# Patient Record
Sex: Female | Born: 1980
Health system: Southern US, Community
[De-identification: ages and names within clinical notes are randomized; demographics above are authoritative.]

## PROBLEM LIST (undated history)

## (undated) DIAGNOSIS — R7989 Other specified abnormal findings of blood chemistry: Secondary | ICD-10-CM

## (undated) DIAGNOSIS — G4730 Sleep apnea, unspecified: Secondary | ICD-10-CM

## (undated) DIAGNOSIS — E119 Type 2 diabetes mellitus without complications: Secondary | ICD-10-CM

## (undated) DIAGNOSIS — N390 Urinary tract infection, site not specified: Secondary | ICD-10-CM

## (undated) DIAGNOSIS — J329 Chronic sinusitis, unspecified: Secondary | ICD-10-CM

## (undated) DIAGNOSIS — N912 Amenorrhea, unspecified: Secondary | ICD-10-CM

## (undated) DIAGNOSIS — A599 Trichomoniasis, unspecified: Secondary | ICD-10-CM

## (undated) DIAGNOSIS — K760 Fatty (change of) liver, not elsewhere classified: Secondary | ICD-10-CM

## (undated) DIAGNOSIS — R200 Anesthesia of skin: Secondary | ICD-10-CM

## (undated) DIAGNOSIS — R112 Nausea with vomiting, unspecified: Secondary | ICD-10-CM

## (undated) DIAGNOSIS — N898 Other specified noninflammatory disorders of vagina: Secondary | ICD-10-CM

## (undated) DIAGNOSIS — R5383 Other fatigue: Secondary | ICD-10-CM

## (undated) DIAGNOSIS — R7303 Prediabetes: Secondary | ICD-10-CM

## (undated) DIAGNOSIS — D649 Anemia, unspecified: Secondary | ICD-10-CM

## (undated) DIAGNOSIS — I10 Essential (primary) hypertension: Secondary | ICD-10-CM

## (undated) DIAGNOSIS — R059 Cough, unspecified: Secondary | ICD-10-CM

## (undated) DIAGNOSIS — R300 Dysuria: Secondary | ICD-10-CM

## (undated) DIAGNOSIS — Z9889 Other specified postprocedural states: Secondary | ICD-10-CM

## (undated) DIAGNOSIS — E669 Obesity, unspecified: Secondary | ICD-10-CM

## (undated) DIAGNOSIS — R05 Cough: Secondary | ICD-10-CM

## (undated) DIAGNOSIS — R0602 Shortness of breath: Secondary | ICD-10-CM

## (undated) DIAGNOSIS — A749 Chlamydial infection, unspecified: Secondary | ICD-10-CM

## (undated) HISTORY — PX: TUBAL LIGATION: SHX77

## (undated) HISTORY — DX: Cough: R05

## (undated) HISTORY — DX: Fatty (change of) liver, not elsewhere classified: K76.0

## (undated) HISTORY — DX: Anesthesia of skin: R20.0

## (undated) HISTORY — DX: Other specified noninflammatory disorders of vagina: N89.8

## (undated) HISTORY — DX: Trichomoniasis, unspecified: A59.9

## (undated) HISTORY — DX: Urinary tract infection, site not specified: N39.0

## (undated) HISTORY — DX: Dysuria: R30.0

## (undated) HISTORY — DX: Type 2 diabetes mellitus without complications: E11.9

## (undated) HISTORY — DX: Amenorrhea, unspecified: N91.2

## (undated) HISTORY — DX: Cough, unspecified: R05.9

## (undated) HISTORY — DX: Other fatigue: R53.83

## (undated) HISTORY — DX: Chlamydial infection, unspecified: A74.9

## (undated) HISTORY — DX: Prediabetes: R73.03

## (undated) HISTORY — DX: Shortness of breath: R06.02

## (undated) HISTORY — DX: Chronic sinusitis, unspecified: J32.9

---

## 2016-11-14 ENCOUNTER — Emergency Department (HOSPITAL_BASED_OUTPATIENT_CLINIC_OR_DEPARTMENT_OTHER)
Admission: EM | Admit: 2016-11-14 | Discharge: 2016-11-14 | Disposition: A | Discharge status: Home / Self Care | Payer: Self-pay | Attending: Emergency Medicine | Admitting: Emergency Medicine

## 2016-11-14 ENCOUNTER — Encounter (HOSPITAL_BASED_OUTPATIENT_CLINIC_OR_DEPARTMENT_OTHER): Payer: Self-pay | Admitting: Emergency Medicine

## 2016-11-14 DIAGNOSIS — Z79899 Other long term (current) drug therapy: Secondary | ICD-10-CM | POA: Insufficient documentation

## 2016-11-14 DIAGNOSIS — N12 Tubulo-interstitial nephritis, not specified as acute or chronic: Secondary | ICD-10-CM | POA: Insufficient documentation

## 2016-11-14 DIAGNOSIS — Z7722 Contact with and (suspected) exposure to environmental tobacco smoke (acute) (chronic): Secondary | ICD-10-CM | POA: Insufficient documentation

## 2016-11-14 DIAGNOSIS — I10 Essential (primary) hypertension: Secondary | ICD-10-CM | POA: Insufficient documentation

## 2016-11-14 HISTORY — DX: Obesity, unspecified: E66.9

## 2016-11-14 HISTORY — DX: Essential (primary) hypertension: I10

## 2016-11-14 LAB — BASIC METABOLIC PANEL
ANION GAP: 8 (ref 5–15)
BUN: 12 mg/dL (ref 6–20)
CHLORIDE: 105 mmol/L (ref 101–111)
CO2: 22 mmol/L (ref 22–32)
Calcium: 9.8 mg/dL (ref 8.9–10.3)
Creatinine, Ser: 0.98 mg/dL (ref 0.44–1.00)
GFR calc Af Amer: >60 mL/min (ref >60)
GFR calc non Af Amer: >60 mL/min (ref >60)
GLUCOSE: 117 mg/dL — AB (ref 65–99)
POTASSIUM: 3.8 mmol/L (ref 3.5–5.1)
Sodium: 135 mmol/L (ref 135–145)

## 2016-11-14 LAB — URINALYSIS, ROUTINE W REFLEX MICROSCOPIC
Bilirubin Urine: NEGATIVE
Glucose, UA: NEGATIVE mg/dL
Hgb urine dipstick: NEGATIVE
Ketones, ur: NEGATIVE mg/dL
NITRITE: NEGATIVE
PH: 6.5 (ref 5.0–8.0)
Protein, ur: NEGATIVE mg/dL
SPECIFIC GRAVITY, URINE: 1.02 (ref 1.005–1.030)

## 2016-11-14 LAB — CBC WITH DIFFERENTIAL/PLATELET
Basophils Absolute: 0 10*3/uL (ref 0.0–0.1)
Basophils Relative: 0 %
Eosinophils Absolute: 0 10*3/uL (ref 0.0–0.7)
Eosinophils Relative: 0 %
HCT: 33.9 % — ABNORMAL LOW (ref 36.0–46.0)
HEMOGLOBIN: 11 g/dL — AB (ref 12.0–15.0)
LYMPHS ABS: 1.9 10*3/uL (ref 0.7–4.0)
LYMPHS PCT: 15 %
MCH: 24.7 pg — AB (ref 26.0–34.0)
MCHC: 32.4 g/dL (ref 30.0–36.0)
MCV: 76 fL — AB (ref 78.0–100.0)
Monocytes Absolute: 1 10*3/uL (ref 0.1–1.0)
Monocytes Relative: 8 %
NEUTROS ABS: 9.8 10*3/uL — AB (ref 1.7–7.7)
NEUTROS PCT: 77 %
Platelets: 352 10*3/uL (ref 150–400)
RBC: 4.46 MIL/uL (ref 3.87–5.11)
RDW: 16.4 % — ABNORMAL HIGH (ref 11.5–15.5)
WBC: 12.7 10*3/uL — AB (ref 4.0–10.5)

## 2016-11-14 LAB — PREGNANCY, URINE: Preg Test, Ur: NEGATIVE

## 2016-11-14 LAB — URINALYSIS, MICROSCOPIC (REFLEX)

## 2016-11-14 MED ORDER — KETOROLAC TROMETHAMINE 15 MG/ML IJ SOLN
15.0000 mg | Freq: Once | INTRAMUSCULAR | Status: AC
  Administered 2016-11-14: 15 mg via INTRAVENOUS
  Filled 2016-11-14: qty 1

## 2016-11-14 MED ORDER — CEPHALEXIN 500 MG PO CAPS: 500.0000 mg | ORAL_CAPSULE | Freq: Three times a day (TID) | ORAL | 0 refills | Status: AC

## 2016-11-14 MED ORDER — PHENAZOPYRIDINE HCL 200 MG PO TABS: 200.0000 mg | ORAL_TABLET | Freq: Three times a day (TID) | ORAL | 0 refills | Status: DC

## 2016-11-14 MED ORDER — HYDROCODONE-ACETAMINOPHEN 5-325 MG PO TABS: 1.0000 | ORAL_TABLET | ORAL | 0 refills | Status: DC | PRN

## 2016-11-14 MED ORDER — SODIUM CHLORIDE 0.9 % IV BOLUS (SEPSIS)
1000.0000 mL | Freq: Once | INTRAVENOUS | Status: AC
  Administered 2016-11-14: 1000 mL via INTRAVENOUS

## 2016-11-14 MED ORDER — HYDROCODONE-ACETAMINOPHEN 5-325 MG PO TABS: 2.0000 | ORAL_TABLET | Freq: Once | ORAL | Status: DC

## 2016-11-14 MED ORDER — DEXTROSE 5 % IV SOLN
2.0000 g | Freq: Once | INTRAVENOUS | Status: AC
  Administered 2016-11-14: 2 g via INTRAVENOUS
  Filled 2016-11-14: qty 2

## 2016-11-14 MED ORDER — PROMETHAZINE HCL 25 MG PO TABS: 25.0000 mg | ORAL_TABLET | Freq: Four times a day (QID) | ORAL | 0 refills | Status: DC | PRN

## 2016-11-14 NOTE — ED Provider Notes (Signed)
[facility] Provider Note   CSN: [id number] Arrival date & time: 11/14/16  0431     History   Chief Complaint Chief Complaint  Patient presents with  . Urinary Frequency    HPI Lisa Mcpherson is a 36 y.o. female.  The history is provided by the patient.   36 year old female with no significant past medical history here with a weeklong history of cloudy and foul-smelling urine. The patient states her symptoms started with a different smell and odor to her urine one week ago. Over the last week, she has had urinary frequency and urgency. Over the last 24 hours, she has developed progressively worsening aching, burning lower back pain with associated chills and nausea. No vomiting. She has had absolutely no vaginal bleeding or discharge. Denies any recent high-risk sex contacts. She is not diabetic or immune suppressed. Denies any alleviating factors. Symptoms are worse with movement as well as palpation. Symptoms also worse with urination.   Past Medical History:  Diagnosis Date  . Hypertension   . Obesity     There are no active problems to display for this patient.   Past Surgical History:  Procedure Laterality Date  . CESAREAN SECTION      OB History    No data available       Home Medications    Prior to Admission medications   Medication Sig Start Date End Date Taking? Authorizing Provider  cephALEXin (KEFLEX) 500 MG capsule Take 1 capsule (500 mg total) by mouth 3 (three) times daily. 11/14/16 11/24/16  Shaune PollackIsaacs, Leevi Cullars, MD  HYDROcodone-acetaminophen (NORCO/VICODIN) 5-325 MG tablet Take 1-2 tablets by mouth every 4 (four) hours as needed for moderate pain or severe pain. 11/14/16   Shaune PollackIsaacs, Monti Villers, MD  phenazopyridine (PYRIDIUM) 200 MG tablet Take 1 tablet (200 mg total) by mouth 3 (three) times daily. 11/14/16   Shaune PollackIsaacs, Chala Gul, MD  promethazine (PHENERGAN) 25 MG tablet Take 1 tablet (25 mg total) by mouth every 6 (six) hours as needed for nausea or  vomiting. 11/14/16   Shaune PollackIsaacs, Palmer Fahrner, MD    Family History No family history on file.  Social History Social History  Substance Use Topics  . Smoking status: Passive Smoke Exposure - Never Smoker  . Smokeless tobacco: Never Used  . Alcohol use Yes     Comment: weekends     Allergies   Patient has no known allergies.   Review of Systems Review of Systems  Constitutional: Positive for chills and fatigue.  Gastrointestinal: Positive for nausea.  Genitourinary: Positive for dysuria, flank pain, frequency and urgency.  All other systems reviewed and are negative.    Physical Exam Updated Vital Signs BP 140/90 (BP Location: Right Arm)   Pulse 91   Temp 98.9 F (37.2 C) (Oral)   Resp 16   Ht 4\' 11"  (1.499 m)   Wt 104.3 kg (230 lb)   LMP 10/19/2016 (Approximate)   SpO2 99%   BMI 46.45 kg/m   Physical Exam  Constitutional: She is oriented to person, place, and time. She appears well-developed and well-nourished. No distress.  HENT:  Head: Normocephalic and atraumatic.  Eyes: Conjunctivae are normal.  Neck: Neck supple.  Cardiovascular: Normal rate, regular rhythm and normal heart sounds.  Exam reveals no friction rub.   No murmur heard. Pulmonary/Chest: Effort normal and breath sounds normal. No respiratory distress. She has no wheezes. She has no rales.  Abdominal: Soft. Bowel sounds are normal. She exhibits no distension. There is tenderness (Mild suprapubic  and bilateral lower quadrants. No overt CVA tenderness bilaterally.). There is no rebound and no guarding.  Musculoskeletal: She exhibits no edema.  No midline thoracic or lumbar tenderness  Neurological: She is alert and oriented to person, place, and time. She exhibits normal muscle tone.  Skin: Skin is warm. Capillary refill takes less than 2 seconds. No rash noted.  Psychiatric: She has a normal mood and affect.  Nursing note and vitals reviewed.    ED Treatments / Results  Labs (all labs ordered are  listed, but only abnormal results are displayed) Labs Reviewed  URINALYSIS, ROUTINE W REFLEX MICROSCOPIC - Abnormal; Notable for the following:       Result Value   APPearance CLOUDY (*)    Leukocytes, UA LARGE (*)    All other components within normal limits  CBC WITH DIFFERENTIAL/PLATELET - Abnormal; Notable for the following:    WBC 12.7 (*)    Hemoglobin 11.0 (*)    HCT 33.9 (*)    MCV 76.0 (*)    MCH 24.7 (*)    RDW 16.4 (*)    Neutro Abs 9.8 (*)    All other components within normal limits  BASIC METABOLIC PANEL - Abnormal; Notable for the following:    Glucose, Bld 117 (*)    All other components within normal limits  URINALYSIS, MICROSCOPIC (REFLEX) - Abnormal; Notable for the following:    Bacteria, UA MANY (*)    Squamous Epithelial / LPF 0-5 (*)    All other components within normal limits  URINE CULTURE  PREGNANCY, URINE    EKG  EKG Interpretation None       Radiology No results found.  Procedures Procedures (including critical care time)  Medications Ordered in ED Medications  sodium chloride 0.9 % bolus 1,000 mL (1,000 mLs Intravenous New Bag/Given 11/14/16 0522)  HYDROcodone-acetaminophen (NORCO/VICODIN) 5-325 MG per tablet 2 tablet (2 tablets Oral Not Given 11/14/16 0530)  cefTRIAXone (ROCEPHIN) 2 g in dextrose 5 % 50 mL IVPB (0 g Intravenous Stopped 11/14/16 0605)  ketorolac (TORADOL) 15 MG/ML injection 15 mg (15 mg Intravenous Given 11/14/16 0525)     Initial Impression / Assessment and Plan / ED Course  I have reviewed the triage vital signs and the nursing notes.  Pertinent labs & imaging results that were available during my care of the patient were reviewed by me and considered in my medical decision making (see chart for details).     36 yo F here with urinary sx and now back pain, c/f early pyelo. No fevers, pt mildly tachycardic on arrival but this is resolved with fluids and pain control. No hypotension or signs of sepsis. Tolerating PO. She  is not diabetic, immune suppressed, or pregnant. Lab work overall reassuring with normal renal fxn. UA is c/w UTI. No vaginal bleeding, discharge, or sx to suggest PID/TOA. Pt given Rocephin here and will d/c with Keflex, pain meds, and antiemetics. Return precautions given. Pt tolerating PO prior to d/c and in NAD. VS improved.   Final Clinical Impressions(s) / ED Diagnoses   Final diagnoses:  Pyelonephritis    New Prescriptions New Prescriptions   CEPHALEXIN (KEFLEX) 500 MG CAPSULE    Take 1 capsule (500 mg total) by mouth 3 (three) times daily.   HYDROCODONE-ACETAMINOPHEN (NORCO/VICODIN) 5-325 MG TABLET    Take 1-2 tablets by mouth every 4 (four) hours as needed for moderate pain or severe pain.   PHENAZOPYRIDINE (PYRIDIUM) 200 MG TABLET    Take 1 tablet (  200 mg total) by mouth 3 (three) times daily.   PROMETHAZINE (PHENERGAN) 25 MG TABLET    Take 1 tablet (25 mg total) by mouth every 6 (six) hours as needed for nausea or vomiting.    This note was prepared with assistance of Conservation officer, historic buildings. Occasional wrong-word or sound-a-like substitutions may have occurred due to the inherent limitations of voice recognition software.  Prior to providing a prescription for a controlled substance, I independently reviewed the patient's recent prescription history on the [place] Controlled Substance Reporting System. The patient had no recent or regular prescriptions and was deemed appropriate for a brief, less than 3 day prescription of narcotic for acute analgesia.   Shaune Pollack, MD 11/14/16 [phone]

## 2016-11-14 NOTE — Discharge Instructions (Signed)
Drink plenty of fluids.  Take the antibiotics as prescribed for the full course.  Take over-the-counter medications such as ibuprofen or Tylenol for mild pain.  Return to the emergency department if your pain worsens, if you develop worsening vomiting, or any other concerning symptoms.

## 2016-11-14 NOTE — ED Notes (Signed)
ED Provider at bedside. 

## 2016-11-14 NOTE — ED Triage Notes (Signed)
Pt reports urinary frequency and cloudy urine x1 week. Sts she had low abd pain at first but now she is having pain in her back.  Pain getting worse.  Urine has foul odor. Denies any vaginal issues.

## 2016-11-16 LAB — URINE CULTURE: Culture: 70000 — AB

## 2016-11-17 ENCOUNTER — Telehealth: Payer: Self-pay | Admitting: *Deleted

## 2016-11-17 NOTE — Telephone Encounter (Signed)
Post ED Visit - Positive Culture Follow-up  Culture report reviewed by antimicrobial stewardship pharmacist:  []  Enzo BiNathan Batchelder, Pharm.D. []  Celedonio MiyamotoJeremy Frens, Pharm.D., BCPS AQ-ID []  Garvin FilaMike Maccia, Pharm.D., BCPS [x]  Georgina PillionElizabeth Martin, Pharm.D., BCPS []  Deep RiverMinh Pham, VermontPharm.D., BCPS, AAHIVP []  Estella HuskMichelle Turner, Pharm.D., BCPS, AAHIVP []  Lysle Pearlachel Rumbarger, PharmD, BCPS []  Casilda Carlsaylor Stone, PharmD, BCPS []  Pollyann SamplesAndy Johnston, PharmD, BCPS  Positive urine culture Treated with Cephalexin, organism sensitive to the same and no further patient follow-up is required at this time.  Virl AxeRobertson, Ondrea Dow [facility] 11/17/2016, 9:33 AM

## 2018-02-17 ENCOUNTER — Observation Stay (HOSPITAL_BASED_OUTPATIENT_CLINIC_OR_DEPARTMENT_OTHER)
Admission: EM | Admit: 2018-02-17 | Discharge: 2018-02-18 | Disposition: A | Discharge status: Home / Self Care | Payer: Medicaid Other | Attending: Internal Medicine | Admitting: Internal Medicine

## 2018-02-17 ENCOUNTER — Emergency Department (HOSPITAL_BASED_OUTPATIENT_CLINIC_OR_DEPARTMENT_OTHER): Payer: Medicaid Other

## 2018-02-17 ENCOUNTER — Other Ambulatory Visit: Payer: Self-pay

## 2018-02-17 ENCOUNTER — Encounter (HOSPITAL_BASED_OUTPATIENT_CLINIC_OR_DEPARTMENT_OTHER): Payer: Self-pay | Admitting: *Deleted

## 2018-02-17 DIAGNOSIS — R0789 Other chest pain: Secondary | ICD-10-CM | POA: Insufficient documentation

## 2018-02-17 DIAGNOSIS — Z6841 Body Mass Index (BMI) 40.0 and over, adult: Secondary | ICD-10-CM | POA: Insufficient documentation

## 2018-02-17 DIAGNOSIS — K769 Liver disease, unspecified: Secondary | ICD-10-CM | POA: Insufficient documentation

## 2018-02-17 DIAGNOSIS — R000 Tachycardia, unspecified: Principal | ICD-10-CM | POA: Diagnosis present

## 2018-02-17 DIAGNOSIS — D649 Anemia, unspecified: Secondary | ICD-10-CM | POA: Diagnosis present

## 2018-02-17 DIAGNOSIS — D509 Iron deficiency anemia, unspecified: Secondary | ICD-10-CM | POA: Insufficient documentation

## 2018-02-17 DIAGNOSIS — R079 Chest pain, unspecified: Secondary | ICD-10-CM | POA: Diagnosis present

## 2018-02-17 DIAGNOSIS — Z8249 Family history of ischemic heart disease and other diseases of the circulatory system: Secondary | ICD-10-CM | POA: Diagnosis not present

## 2018-02-17 DIAGNOSIS — Z79899 Other long term (current) drug therapy: Secondary | ICD-10-CM | POA: Diagnosis not present

## 2018-02-17 DIAGNOSIS — R7989 Other specified abnormal findings of blood chemistry: Secondary | ICD-10-CM

## 2018-02-17 DIAGNOSIS — N920 Excessive and frequent menstruation with regular cycle: Secondary | ICD-10-CM | POA: Diagnosis not present

## 2018-02-17 DIAGNOSIS — Z7722 Contact with and (suspected) exposure to environmental tobacco smoke (acute) (chronic): Secondary | ICD-10-CM | POA: Insufficient documentation

## 2018-02-17 DIAGNOSIS — Z9889 Other specified postprocedural states: Secondary | ICD-10-CM | POA: Insufficient documentation

## 2018-02-17 DIAGNOSIS — I10 Essential (primary) hypertension: Secondary | ICD-10-CM

## 2018-02-17 DIAGNOSIS — R109 Unspecified abdominal pain: Secondary | ICD-10-CM

## 2018-02-17 HISTORY — DX: Tachycardia, unspecified: R00.0

## 2018-02-17 HISTORY — DX: Other specified abnormal findings of blood chemistry: R79.89

## 2018-02-17 HISTORY — DX: Chest pain, unspecified: R07.9

## 2018-02-17 HISTORY — DX: Anemia, unspecified: D64.9

## 2018-02-17 HISTORY — DX: Essential (primary) hypertension: I10

## 2018-02-17 LAB — CBC WITH DIFFERENTIAL/PLATELET
Abs Immature Granulocytes: 0.03 10*3/uL (ref 0.00–0.07)
Basophils Absolute: 0 10*3/uL (ref 0.0–0.1)
Basophils Relative: 0 %
EOS ABS: 0 10*3/uL (ref 0.0–0.5)
EOS PCT: 0 %
HEMATOCRIT: 35.5 % — AB (ref 36.0–46.0)
Hemoglobin: 10.8 g/dL — ABNORMAL LOW (ref 12.0–15.0)
IMMATURE GRANULOCYTES: 0 %
LYMPHS ABS: 2.4 10*3/uL (ref 0.7–4.0)
Lymphocytes Relative: 33 %
MCH: 23.3 pg — ABNORMAL LOW (ref 26.0–34.0)
MCHC: 30.4 g/dL (ref 30.0–36.0)
MCV: 76.5 fL — AB (ref 80.0–100.0)
MONOS PCT: 13 %
Monocytes Absolute: 1 10*3/uL (ref 0.1–1.0)
Neutro Abs: 4 10*3/uL (ref 1.7–7.7)
Neutrophils Relative %: 54 %
Platelets: 381 10*3/uL (ref 150–400)
RBC: 4.64 MIL/uL (ref 3.87–5.11)
RDW: 17.9 % — AB (ref 11.5–15.5)
WBC: 7.5 10*3/uL (ref 4.0–10.5)
nRBC: 0 % (ref 0.0–0.2)

## 2018-02-17 LAB — URINALYSIS, ROUTINE W REFLEX MICROSCOPIC
Bilirubin Urine: NEGATIVE
Glucose, UA: NEGATIVE mg/dL
HGB URINE DIPSTICK: NEGATIVE
Ketones, ur: NEGATIVE mg/dL
LEUKOCYTES UA: NEGATIVE
Nitrite: NEGATIVE
Protein, ur: NEGATIVE mg/dL
SPECIFIC GRAVITY, URINE: 1.025 (ref 1.005–1.030)
pH: 6 (ref 5.0–8.0)

## 2018-02-17 LAB — CBC
HEMATOCRIT: 32.2 % — AB (ref 36.0–46.0)
HEMOGLOBIN: 9.8 g/dL — AB (ref 12.0–15.0)
MCH: 23 pg — ABNORMAL LOW (ref 26.0–34.0)
MCHC: 30.4 g/dL (ref 30.0–36.0)
MCV: 75.4 fL — ABNORMAL LOW (ref 80.0–100.0)
Platelets: 367 10*3/uL (ref 150–400)
RBC: 4.27 MIL/uL (ref 3.87–5.11)
RDW: 18 % — ABNORMAL HIGH (ref 11.5–15.5)
WBC: 8.2 10*3/uL (ref 4.0–10.5)
nRBC: 0 % (ref 0.0–0.2)

## 2018-02-17 LAB — COMPREHENSIVE METABOLIC PANEL
ALBUMIN: 3.7 g/dL (ref 3.5–5.0)
ALT: 21 U/L (ref 0–44)
AST: 24 U/L (ref 15–41)
Alkaline Phosphatase: 71 U/L (ref 38–126)
Anion gap: 8 (ref 5–15)
BUN: 12 mg/dL (ref 6–20)
CHLORIDE: 104 mmol/L (ref 98–111)
CO2: 26 mmol/L (ref 22–32)
CREATININE: 0.83 mg/dL (ref 0.44–1.00)
Calcium: 9 mg/dL (ref 8.9–10.3)
GFR calc Af Amer: >60 mL/min (ref >60)
GLUCOSE: 130 mg/dL — AB (ref 70–99)
Potassium: 3.4 mmol/L — ABNORMAL LOW (ref 3.5–5.1)
Sodium: 138 mmol/L (ref 135–145)
Total Bilirubin: 0.6 mg/dL (ref 0.3–1.2)
Total Protein: 7.3 g/dL (ref 6.5–8.1)

## 2018-02-17 LAB — CREATININE, SERUM
CREATININE: 0.75 mg/dL (ref 0.44–1.00)
GFR calc Af Amer: >60 mL/min (ref >60)

## 2018-02-17 LAB — TROPONIN I: Troponin I: <0.03 ng/mL (ref <0.03)

## 2018-02-17 LAB — D-DIMER, QUANTITATIVE: D-Dimer, Quant: 0.33 ug/mL-FEU (ref 0.00–0.50)

## 2018-02-17 LAB — TSH: TSH: 0.037 u[IU]/mL — AB (ref 0.350–4.500)

## 2018-02-17 LAB — PREGNANCY, URINE: PREG TEST UR: NEGATIVE

## 2018-02-17 LAB — CBG MONITORING, ED: Glucose-Capillary: 188 mg/dL — ABNORMAL HIGH (ref 70–99)

## 2018-02-17 MED ORDER — SODIUM CHLORIDE 0.9 % IV BOLUS
1000.0000 mL | Freq: Once | INTRAVENOUS | Status: AC
  Administered 2018-02-17: 1000 mL via INTRAVENOUS

## 2018-02-17 MED ORDER — ONDANSETRON HCL 4 MG PO TABS: 4.0000 mg | ORAL_TABLET | Freq: Four times a day (QID) | ORAL | Status: DC | PRN

## 2018-02-17 MED ORDER — ONDANSETRON HCL 4 MG/2ML IJ SOLN: 4.0000 mg | Freq: Four times a day (QID) | INTRAMUSCULAR | Status: DC | PRN

## 2018-02-17 MED ORDER — ACETAMINOPHEN 650 MG RE SUPP: 650.0000 mg | Freq: Four times a day (QID) | RECTAL | Status: DC | PRN

## 2018-02-17 MED ORDER — ENOXAPARIN SODIUM 40 MG/0.4ML ~~LOC~~ SOLN
40.0000 mg | Freq: Every day | SUBCUTANEOUS | Status: DC
  Administered 2018-02-17: 40 mg via SUBCUTANEOUS
  Filled 2018-02-17: qty 0.4

## 2018-02-17 MED ORDER — MORPHINE SULFATE (PF) 2 MG/ML IV SOLN
0.5000 mg | Freq: Once | INTRAVENOUS | Status: AC
  Administered 2018-02-17: 0.5 mg via INTRAVENOUS
  Filled 2018-02-17: qty 1

## 2018-02-17 MED ORDER — ACETAMINOPHEN 325 MG PO TABS
650.0000 mg | ORAL_TABLET | Freq: Four times a day (QID) | ORAL | Status: DC | PRN
  Administered 2018-02-18: 650 mg via ORAL
  Filled 2018-02-17: qty 2

## 2018-02-17 MED ORDER — LISINOPRIL 5 MG PO TABS
12.5000 mg | ORAL_TABLET | Freq: Every day | ORAL | Status: DC
  Administered 2018-02-18: 12.5 mg via ORAL
  Filled 2018-02-17: qty 1

## 2018-02-17 MED ORDER — METOPROLOL TARTRATE 5 MG/5ML IV SOLN
5.0000 mg | Freq: Once | INTRAVENOUS | Status: AC
  Administered 2018-02-17: 5 mg via INTRAVENOUS
  Filled 2018-02-17: qty 5

## 2018-02-17 NOTE — ED Notes (Signed)
Pt lying flat for 10 minutes. 

## 2018-02-17 NOTE — ED Provider Notes (Signed)
[facility] EMERGENCY DEPARTMENT Provider Note   CSN: [id number] Arrival date & time: 02/17/18  1407     History   Chief Complaint Chief Complaint  Patient presents with  . Chest Pain    HPI Lisa Mcpherson is a 37 y.o. female.  Patient presents with approximately 5-day history of generalized body aches, chest pain, shortness of breath.  She has a history of hypertension and takes lisinopril, otherwise no medications.  Chest pain is generalized.  She has been active without significant shortness of breath with activity.  She reports some pain in her bilateral flanks.  No fever, URI symptoms, or cough.  No viral type illnesses in the past several months.  She does not have a history of any thyroid issues and denies any recent weight changes.  She states that she has been very thirsty and has been urinating frequently but denies dysuria.  She denies heavy vaginal bleeding.  She has been constipated recently but has noted blood in her stool in the past month.  No treatments prior to arrival.  She states "I know there is something wrong in my chest, I just do not know what".  The onset of this condition was acute. The course is constant. Aggravating factors: none. Alleviating factors: none.       Past Medical History:  Diagnosis Date  . Hypertension   . Obesity     There are no active problems to display for this patient.   Past Surgical History:  Procedure Laterality Date  . CESAREAN SECTION       OB History   None      Home Medications    Prior to Admission medications   Medication Sig Start Date End Date Taking? Authorizing Provider  lisinopril (PRINIVIL,ZESTRIL) 10 MG tablet Take 12.5 mg by mouth daily.   Yes [provider]  HYDROcodone-acetaminophen (NORCO/VICODIN) 5-325 MG tablet Take 1-2 tablets by mouth every 4 (four) hours as needed for moderate pain or severe pain. 11/14/16   Shaune Pollack, MD  phenazopyridine (PYRIDIUM) 200 MG tablet Take  1 tablet (200 mg total) by mouth 3 (three) times daily. 11/14/16   Shaune Pollack, MD  promethazine (PHENERGAN) 25 MG tablet Take 1 tablet (25 mg total) by mouth every 6 (six) hours as needed for nausea or vomiting. 11/14/16   Shaune Pollack, MD    Family History History reviewed. No pertinent family history.  Social History Social History   Tobacco Use  . Smoking status: Passive Smoke Exposure - Never Smoker  . Smokeless tobacco: Never Used  Substance Use Topics  . Alcohol use: Yes    Comment: weekends  . Drug use: No     Allergies   Patient has no known allergies.   Review of Systems Review of Systems  Constitutional: Negative for diaphoresis and fever.  Eyes: Negative for redness.  Respiratory: Positive for shortness of breath. Negative for cough.   Cardiovascular: Positive for chest pain. Negative for palpitations and leg swelling.  Gastrointestinal: Positive for blood in stool. Negative for abdominal pain, nausea and vomiting.  Genitourinary: Negative for dysuria, vaginal bleeding and vaginal discharge.  Musculoskeletal: Positive for myalgias. Negative for back pain and neck pain.  Skin: Negative for rash.  Neurological: Negative for syncope and light-headedness.  Psychiatric/Behavioral: The patient is not nervous/anxious.      Physical Exam Updated Vital Signs BP 131/79 (BP Location: Right Arm)   Pulse (!) 133   Temp 98.9 F (37.2 C) (Oral)   Resp  20   Ht 4\' 11"  (1.499 m)   Wt 103.4 kg   LMP 01/26/2018   SpO2 99%   BMI 46.05 kg/m   Physical Exam  Constitutional: She appears well-developed and well-nourished.  HENT:  Head: Normocephalic and atraumatic.  Mouth/Throat: Oropharynx is clear and moist and mucous membranes are normal. Mucous membranes are not dry.  Eyes: Conjunctivae are normal.  Neck: Trachea normal and normal range of motion. Neck supple. Normal carotid pulses and no JVD present. No muscular tenderness present. Carotid bruit is not present.  No tracheal deviation present.  Cardiovascular: Regular rhythm, S1 normal, S2 normal, normal heart sounds and intact distal pulses. Tachycardia present. Exam reveals no decreased pulses.  No murmur heard. Pulmonary/Chest: Effort normal. No respiratory distress. She has no wheezes. She exhibits no tenderness.  Abdominal: Soft. Normal aorta and bowel sounds are normal. There is no tenderness. There is no rebound and no guarding.  Mild flank tenderness to palpation.   Musculoskeletal: Normal range of motion.  Neurological: She is alert.  Skin: Skin is warm and dry. She is not diaphoretic. No cyanosis. No pallor.  Psychiatric: She has a normal mood and affect.  Nursing note and vitals reviewed.    ED Treatments / Results  Labs (all labs ordered are listed, but only abnormal results are displayed) Labs Reviewed  CBC WITH DIFFERENTIAL/PLATELET - Abnormal; Notable for the following components:      Result Value   Hemoglobin 10.8 (*)    HCT 35.5 (*)    MCV 76.5 (*)    MCH 23.3 (*)    RDW 17.9 (*)    All other components within normal limits  URINALYSIS, ROUTINE W REFLEX MICROSCOPIC - Abnormal; Notable for the following components:   APPearance HAZY (*)    All other components within normal limits  TSH - Abnormal; Notable for the following components:   TSH 0.037 (*)    All other components within normal limits  COMPREHENSIVE METABOLIC PANEL - Abnormal; Notable for the following components:   Potassium 3.4 (*)    Glucose, Bld 130 (*)    All other components within normal limits  CBG MONITORING, ED - Abnormal; Notable for the following components:   Glucose-Capillary 188 (*)    All other components within normal limits  TROPONIN I  D-DIMER, QUANTITATIVE (NOT AT [facility])  PREGNANCY, URINE    EKG EKG Interpretation  Date/Time:  Thursday February 17 2018 14:16:25 EDT Ventricular Rate:  125 PR Interval:  130 QRS Duration: 84 QT Interval:  312 QTC Calculation: 450 R Axis:   67 Text  Interpretation:  Sinus tachycardia T wave abnormality, consider inferior ischemia Abnormal ECG no prior to compare with Confirmed by Meridee Score [id number]) on 02/17/2018 2:22:10 PM   Radiology No results found.  Procedures Procedures (including critical care time)  Medications Ordered in ED Medications  sodium chloride 0.9 % bolus 1,000 mL (1,000 mLs Intravenous New Bag/Given 02/17/18 1511)     Initial Impression / Assessment and Plan / ED Course  I have reviewed the triage vital signs and the nursing notes.  Pertinent labs & imaging results that were available during my care of the patient were reviewed by me and considered in my medical decision making (see chart for details).     Patient seen and examined. Work-up initiated. Fluids ordered. CBG 188.   Vital signs reviewed and are as follows: BP 131/79 (BP Location: Right Arm)   Pulse (!) 133   Temp 98.9 F (37.2 C) (  Oral)   Resp 20   Ht 4\' 11"  (1.499 m)   Wt 103.4 kg   LMP 01/26/2018   SpO2 99%   BMI 46.05 kg/m   3:47 PM Work-up neg to this point. Neg pregnancy. No anemia. Neg d-dimer. Pending TSH, troponin.   8:40 PM Dr. Dalene Seltzer has spoken with hospitalist.   TSH has resulted low, thyrotoxicosis may be cause of symptoms.   Final Clinical Impressions(s) / ED Diagnoses   Final diagnoses:  Chest pain, unspecified type  Tachycardia  Low TSH level   Admit.    ED Discharge Orders    None       Renne Crigler, Cordelia Poche 02/17/18 2042    Alvira Monday, MD 02/18/18 1230

## 2018-02-17 NOTE — ED Triage Notes (Signed)
Pt has been feeling a flutter in her chest along with tightness. This has been going on since Sunday. Now is also experiencing body aches.

## 2018-02-17 NOTE — ED Notes (Signed)
Attempted to call report to [facility]E [phone]; no answer.

## 2018-02-17 NOTE — ED Notes (Signed)
Report to [place], Charity fundraiser at [facility].

## 2018-02-17 NOTE — ED Notes (Signed)
PA at bedside.

## 2018-02-17 NOTE — H&P (Signed)
History and Physical    Lisa Mcpherson ZOX:[id number] DOB: Sep 29, 1980 DOA: 02/17/2018  [facility]P: [facility]  Patient coming from: Home.  Chief Complaint: Palpitation and chest pain.  HPI: Lisa Mcpherson is a 37 y.o. female with history of hypertension, microcytic hypochromic anemia presents to the ER admits in [facility] with complaints of chest pain and palpitation.  Patient symptoms started 2 days ago with chest pain which was retrosternal occurring multiple times a day even at rest.  Had some nausea denies any vomiting had some right upper quadrant pain which is been ongoing for last 1 month.  Denies any diarrhea.  Socially patient started developing palpitation denies any diaphoresis or shortness of breath.  Has been taking lisinopril for blood pressure for last 2 to 3 years.  Has not had any associated dizziness or loss of consciousness.  Denies using any drugs.  ED Course: In the ER patient was found to be in sinus tachycardia with heart rate around 130s so he was given fluid bolus despite which patient heart rate remained high.  D-dimer was negative.  TSH was 0.037.  Patient admitted for further management.  On my exam patient is mildly tender in the epigastric area.  Still tachycardic.  Was given 5 mg IV metoprolol in the ER.  Review of Systems: As per HPI, rest all negative.   Past Medical History:  Diagnosis Date  . Hypertension   . Obesity     Past Surgical History:  Procedure Laterality Date  . CESAREAN SECTION       reports that she is a non-smoker but has been exposed to tobacco smoke. She has never used smokeless tobacco. She reports that she drinks alcohol. She reports that she does not use drugs.  No Known Allergies  Family History  Problem Relation Age of Onset  . CAD Mother   . Stroke Neg Hx     Prior to Admission medications   Medication Sig Start Date End Date Taking? Authorizing Provider  lisinopril (PRINIVIL,ZESTRIL) 10 MG tablet Take 12.5 mg  by mouth daily.   Yes [provider]  HYDROcodone-acetaminophen (NORCO/VICODIN) 5-325 MG tablet Take 1-2 tablets by mouth every 4 (four) hours as needed for moderate pain or severe pain. 11/14/16   Shaune Pollack, MD  phenazopyridine (PYRIDIUM) 200 MG tablet Take 1 tablet (200 mg total) by mouth 3 (three) times daily. 11/14/16   Shaune Pollack, MD  promethazine (PHENERGAN) 25 MG tablet Take 1 tablet (25 mg total) by mouth every 6 (six) hours as needed for nausea or vomiting. 11/14/16   Shaune Pollack, MD    Physical Exam: Vitals:   02/17/18 1930 02/17/18 2000 02/17/18 2030 02/17/18 2142  BP: (!) 134/41 124/60 117/80 (!) 142/82  Pulse: (!) 110 (!) 109 (!) 117 (!) 121  Resp: 16 (!) 23 (!) 30 19  Temp:    98.4 F (36.9 C)  TempSrc:    Oral  SpO2: 100% 100% 100% 100%  Weight:    120.2 kg  Height:    4\' 11"  (1.499 m)      Constitutional: Moderately built and nourished. Vitals:   02/17/18 1930 02/17/18 2000 02/17/18 2030 02/17/18 2142  BP: (!) 134/41 124/60 117/80 (!) 142/82  Pulse: (!) 110 (!) 109 (!) 117 (!) 121  Resp: 16 (!) 23 (!) 30 19  Temp:    98.4 F (36.9 C)  TempSrc:    Oral  SpO2: 100% 100% 100% 100%  Weight:    120.2 kg  Height:    4\' 11"  (1.499 m)   Eyes: Anicteric no pallor. ENMT: No discharge from the ears eyes nose or mouth. Neck: No mass felt.  No neck rigidity.  No JVD appreciated. Respiratory: No rhonchi or crepitations. Cardiovascular: S1-S2 heard tachycardic. Abdomen: Right upper quadrant tenderness.  No guarding or rigidity no rebound tenderness bowel sounds present. Musculoskeletal: No edema.  No joint effusion. Skin: No rash. Neurologic: Alert awake oriented to time place and person.  Moves all extremities. Psychiatric: Appears normal.  Normal affect.   Labs on Admission: I have personally reviewed following labs and imaging studies  CBC: Recent Labs  Lab 02/17/18 1507  WBC 7.5  NEUTROABS 4.0  HGB 10.8*  HCT 35.5*  MCV 76.5*  PLT 381     Basic Metabolic Panel: Recent Labs  Lab 02/17/18 1507  NA 138  K 3.4*  CL 104  CO2 26  GLUCOSE 130*  BUN 12  CREATININE 0.83  CALCIUM 9.0   GFR: Estimated Creatinine Clearance: 109.5 mL/min (by C-G formula based on SCr of 0.83 mg/dL). Liver Function Tests: Recent Labs  Lab 02/17/18 1507  AST 24  ALT 21  ALKPHOS 71  BILITOT 0.6  PROT 7.3  ALBUMIN 3.7   No results for input(s): LIPASE, AMYLASE in the last 168 hours. No results for input(s): AMMONIA in the last 168 hours. Coagulation Profile: No results for input(s): INR, PROTIME in the last 168 hours. Cardiac Enzymes: Recent Labs  Lab 02/17/18 1507  TROPONINI <0.03   BNP (last 3 results) No results for input(s): PROBNP in the last 8760 hours. HbA1C: No results for input(s): HGBA1C in the last 72 hours. CBG: Recent Labs  Lab 02/17/18 1445  GLUCAP 188*   Lipid Profile: No results for input(s): CHOL, HDL, LDLCALC, TRIG, CHOLHDL, LDLDIRECT in the last 72 hours. Thyroid Function Tests: Recent Labs    02/17/18 1507  TSH 0.037*   Anemia Panel: No results for input(s): VITAMINB12, FOLATE, FERRITIN, TIBC, IRON, RETICCTPCT in the last 72 hours. Urine analysis:    Component Value Date/Time   COLORURINE YELLOW 02/17/2018 1507   APPEARANCEUR HAZY (A) 02/17/2018 1507   LABSPEC 1.025 02/17/2018 1507   PHURINE 6.0 02/17/2018 1507   GLUCOSEU NEGATIVE 02/17/2018 1507   HGBUR NEGATIVE 02/17/2018 1507   BILIRUBINUR NEGATIVE 02/17/2018 1507   KETONESUR NEGATIVE 02/17/2018 1507   PROTEINUR NEGATIVE 02/17/2018 1507   NITRITE NEGATIVE 02/17/2018 1507   LEUKOCYTESUR NEGATIVE 02/17/2018 1507   Sepsis Labs: @LABRCNTIP (procalcitonin:4,lacticidven:4) )No results found for this or any previous visit (from the past 240 hour(s)).   Radiological Exams on Admission: Dg Chest 2 View  Result Date: 02/17/2018 CLINICAL DATA:  Chest tightness, body aches EXAM: CHEST - 2 VIEW COMPARISON:  None. FINDINGS: The heart size and  mediastinal contours are within normal limits. Both lungs are clear. The visualized skeletal structures are unremarkable. IMPRESSION: No active cardiopulmonary disease. Electronically Signed   By: Elige Ko   On: 02/17/2018 15:32    EKG: Independently reviewed.  Sinus tachycardia.  Assessment/Plan Active Problems:   Tachycardia   Chest pain   Essential hypertension    1. Chest pain with tachycardia and palpitations -d-dimer is negative.  TSH is low and we will check free T4 and free T3.  Will consult cardiology for their input.  Patient has no definite signs of any pericarditis and has been afebrile.  Will check urine drug screen. 2. Right upper quadrant tenderness we will check sonogram of the abdomen for gallbladder pathology  follow LFTs. 3. History of hypertension on lisinopril. 4. Microcytic hypochromic anemia appears to be chronic.  Follow CBC and further work-up as outpatient.   DVT prophylaxis: Lovenox. Code Status: Full code. Family Communication: Discussed with patient. Disposition Plan: Home. Consults called: Cardiology. Admission status: Observation.   Eduard Clos MD [facility] Pager [phone].  If 7PM-7AM, please contact night-coverage [email or web address] Password [email or web address]  02/17/2018, 10:05 PM

## 2018-02-18 ENCOUNTER — Other Ambulatory Visit: Payer: Self-pay | Admitting: Cardiology

## 2018-02-18 ENCOUNTER — Telehealth: Payer: Self-pay | Admitting: Endocrinology

## 2018-02-18 ENCOUNTER — Observation Stay (HOSPITAL_COMMUNITY): Payer: Medicaid Other

## 2018-02-18 ENCOUNTER — Encounter (HOSPITAL_COMMUNITY): Payer: Self-pay | Admitting: Internal Medicine

## 2018-02-18 DIAGNOSIS — I10 Essential (primary) hypertension: Secondary | ICD-10-CM | POA: Diagnosis not present

## 2018-02-18 DIAGNOSIS — D500 Iron deficiency anemia secondary to blood loss (chronic): Secondary | ICD-10-CM

## 2018-02-18 DIAGNOSIS — R000 Tachycardia, unspecified: Secondary | ICD-10-CM

## 2018-02-18 DIAGNOSIS — D649 Anemia, unspecified: Secondary | ICD-10-CM | POA: Diagnosis present

## 2018-02-18 DIAGNOSIS — R002 Palpitations: Secondary | ICD-10-CM

## 2018-02-18 DIAGNOSIS — R7989 Other specified abnormal findings of blood chemistry: Secondary | ICD-10-CM | POA: Diagnosis not present

## 2018-02-18 DIAGNOSIS — R072 Precordial pain: Secondary | ICD-10-CM | POA: Diagnosis not present

## 2018-02-18 DIAGNOSIS — R079 Chest pain, unspecified: Secondary | ICD-10-CM

## 2018-02-18 LAB — CBC
HEMATOCRIT: 32 % — AB (ref 36.0–46.0)
Hemoglobin: 9.4 g/dL — ABNORMAL LOW (ref 12.0–15.0)
MCH: 22.4 pg — ABNORMAL LOW (ref 26.0–34.0)
MCHC: 29.4 g/dL — ABNORMAL LOW (ref 30.0–36.0)
MCV: 76.4 fL — AB (ref 80.0–100.0)
Platelets: 370 10*3/uL (ref 150–400)
RBC: 4.19 MIL/uL (ref 3.87–5.11)
RDW: 18.1 % — ABNORMAL HIGH (ref 11.5–15.5)
WBC: 7.6 10*3/uL (ref 4.0–10.5)
nRBC: 0 % (ref 0.0–0.2)

## 2018-02-18 LAB — TROPONIN I: Troponin I: <0.03 ng/mL (ref <0.03)

## 2018-02-18 LAB — RAPID URINE DRUG SCREEN, HOSP PERFORMED
AMPHETAMINES: NOT DETECTED
Barbiturates: NOT DETECTED
Benzodiazepines: NOT DETECTED
Cocaine: NOT DETECTED
Opiates: NOT DETECTED
Tetrahydrocannabinol: NOT DETECTED

## 2018-02-18 LAB — RETICULOCYTES
Immature Retic Fract: 19.6 % — ABNORMAL HIGH (ref 2.3–15.9)
RBC.: 4.64 MIL/uL (ref 3.87–5.11)
RETIC COUNT ABSOLUTE: 77 10*3/uL (ref 19.0–186.0)
Retic Ct Pct: 1.7 % (ref 0.4–3.1)

## 2018-02-18 LAB — BASIC METABOLIC PANEL
Anion gap: 8 (ref 5–15)
BUN: 9 mg/dL (ref 6–20)
CHLORIDE: 111 mmol/L (ref 98–111)
CO2: 21 mmol/L — AB (ref 22–32)
CREATININE: 0.65 mg/dL (ref 0.44–1.00)
Calcium: 8.3 mg/dL — ABNORMAL LOW (ref 8.9–10.3)
GFR calc Af Amer: >60 mL/min (ref >60)
GFR calc non Af Amer: >60 mL/min (ref >60)
Glucose, Bld: 107 mg/dL — ABNORMAL HIGH (ref 70–99)
Potassium: 3.6 mmol/L (ref 3.5–5.1)
SODIUM: 140 mmol/L (ref 135–145)

## 2018-02-18 LAB — VITAMIN B12: VITAMIN B 12: 1040 pg/mL — AB (ref 180–914)

## 2018-02-18 LAB — HIV ANTIBODY (ROUTINE TESTING W REFLEX): HIV Screen 4th Generation wRfx: NONREACTIVE

## 2018-02-18 LAB — IRON AND TIBC
Iron: 48 ug/dL (ref 28–170)
Saturation Ratios: 12 % (ref 10.4–31.8)
TIBC: 403 ug/dL (ref 250–450)
UIBC: 355 ug/dL

## 2018-02-18 LAB — T4, FREE: FREE T4: 1.71 ng/dL (ref 0.82–1.77)

## 2018-02-18 LAB — FERRITIN: Ferritin: 11 ng/mL (ref 11–307)

## 2018-02-18 LAB — FOLATE: FOLATE: 9.7 ng/mL (ref >5.9)

## 2018-02-18 MED ORDER — METOPROLOL SUCCINATE ER 25 MG PO TB24: 25.0000 mg | ORAL_TABLET | Freq: Every day | ORAL | 1 refills | Status: AC

## 2018-02-18 MED ORDER — METOPROLOL SUCCINATE ER 25 MG PO TB24
25.0000 mg | ORAL_TABLET | Freq: Every day | ORAL | Status: DC
Start: 2018-02-19 — End: 2018-02-18

## 2018-02-18 MED ORDER — LISINOPRIL 10 MG PO TABS: 10.0000 mg | ORAL_TABLET | Freq: Every day | ORAL | 1 refills | Status: AC

## 2018-02-18 NOTE — Consult Note (Addendum)
Cardiology Consultation:   Patient ID: Lisa Mcpherson MRN: [id number]; DOB: March 15, 1981  Admit date: 02/17/2018 Date of Consult: 02/18/2018  Primary Care Provider: [facility] Primary Cardiologist: New (Dr. Royann Shivers) Primary Electrophysiologist:  None   Patient Profile:   Lisa Mcpherson is a 37 y.o. obese AA female with a hx of treated HTN, prediabetes, family h/o heart disease (mother w/ MI in her early 42s, maternal grandmother with CAD s/p CABG in her 15s), ?OSA and microcytic hypochromic anemia  who is being seen today for the evaluation of chest pain and palpitations at the request of Dr. Benjamine Mola, Internal Medicine.   History of Present Illness:   Ms. Downie has HTN and is on medical therapy with lisinopril-HCTZ as an outpatient. Also family h/o CAD mother w/ MI in her early 37s, maternal grandmother with CAD s/p CABG in her 15s. Pt also with diagnosis of prediabetes. No other cardiac risk factors and no known heart disease. She is obese and does not get much exercise. She has a h/o snoring and based on her description, it sounds like she was at one point diagnosed with OSA. She recalls having a sleep study several years ago and was told that she had pauses in breathing during sleep. She wore a CPAP for a while, but was not fully compliant and ended up returning the device. She also notes h/o menorrhagia and recent pica cravings, including ice and corn starch.   She presented to the [facility] ED on 02/17/18 with complaints of palpitations and chest pain. Symptom onset 3 days ago. Substernal chest tightness occurring at rest.  Non radiating. No particular exacerbating factors. Sometimes worse with exertion but not always. Feels better to lay prone. No associated dyspnea. No association with meals but feels better drinking cold fluids. Also intermittent palpitations, but no dizziness, syncope/ near syncope. She denies heavy caffeine consumption.  She also notes recent  RUQ pain, but RUQ [place]  negative for gallbladder pathology. There is however diffuse increased echo texture of the liver, nonspecific but can be seen in fatty infiltration of liver.  In the ED, she was found to be in sinus tachycardia with HR in the 130s. D-dimer was negative, ruling out PE. Urine pregnancy test negative. TSH was found to be low at 0.037. Free T4 is normal at 1.71, however Free T3 lab still pending. CBC notable for anemia w/ Hgb of 9.8 (previously 11.0 1 yr ago). Appears to be microcytic anemia with MCV at 75.4. Further anemia w/u per IM. UA negative. BMP notable for hypokalemia on admit w/ K of 3.4. Resolved with supplementation. Renal function normal w/ Scr at 0.83. Troponin negative x 3. Admit EKG showed sinus tach 125 bpm with inferior ST abnormalities, no prior EKGs for comparison. Echo not done. Cardiology asked to evaluate.    Past Medical History:  Diagnosis Date  . Hypertension   . Obesity     Past Surgical History:  Procedure Laterality Date  . CESAREAN SECTION       Home Medications:  Prior to Admission medications   Medication Sig Start Date End Date Taking? Authorizing Provider  linaclotide (LINZESS) 290 MCG CAPS capsule Take 290 mcg by mouth daily as needed (constipation).    Yes [provider]  lisinopril-hydrochlorothiazide (PRINZIDE,ZESTORETIC) 20-12.5 MG tablet Take 1 tablet by mouth daily.   Yes [provider]  HYDROcodone-acetaminophen (NORCO/VICODIN) 5-325 MG tablet Take 1-2 tablets by mouth every 4 (four) hours as needed for moderate pain or severe pain. Patient not taking:  Reported on 02/18/2018 11/14/16   Shaune Pollack, MD  phenazopyridine (PYRIDIUM) 200 MG tablet Take 1 tablet (200 mg total) by mouth 3 (three) times daily. Patient not taking: Reported on 02/18/2018 11/14/16   Shaune Pollack, MD  promethazine (PHENERGAN) 25 MG tablet Take 1 tablet (25 mg total) by mouth every 6 (six) hours as needed for nausea or vomiting. Patient not taking: Reported on  02/18/2018 11/14/16   Shaune Pollack, MD    Inpatient Medications: Scheduled Meds: . enoxaparin (LOVENOX) injection  40 mg Subcutaneous QHS  . lisinopril  12.5 mg Oral Daily   Continuous Infusions:  PRN Meds: acetaminophen **OR** acetaminophen, ondansetron **OR** ondansetron (ZOFRAN) IV  Allergies:   No Known Allergies  Social History:   Social History   Socioeconomic History  . Marital status: Single    Spouse name: Not on file  . Number of children: Not on file  . Years of education: Not on file  . Highest education level: Not on file  Occupational History  . Not on file  Social Needs  . Financial resource strain: Not on file  . Food insecurity:    Worry: Not on file    Inability: Not on file  . Transportation needs:    Medical: Not on file    Non-medical: Not on file  Tobacco Use  . Smoking status: Passive Smoke Exposure - Never Smoker  . Smokeless tobacco: Never Used  Substance and Sexual Activity  . Alcohol use: Yes    Comment: weekends  . Drug use: No  . Sexual activity: Not on file  Lifestyle  . Physical activity:    Days per week: Not on file    Minutes per session: Not on file  . Stress: Not on file  Relationships  . Social connections:    Talks on phone: Not on file    Gets together: Not on file    Attends religious service: Not on file    Active member of club or organization: Not on file    Attends meetings of clubs or organizations: Not on file    Relationship status: Not on file  . Intimate partner violence:    Fear of current or ex partner: Not on file    Emotionally abused: Not on file    Physically abused: Not on file    Forced sexual activity: Not on file  Other Topics Concern  . Not on file  Social History Narrative  . Not on file    Family History:    Family History  Problem Relation Age of Onset  . CAD Mother   . Stroke Neg Hx      ROS:  Please see the history of present illness.   All other ROS reviewed and negative.       Physical Exam/Data:   Vitals:   02/18/18 0009 02/18/18 0458 02/18/18 0840 02/18/18 1222  BP: (!) 117/59 102/69 108/67 113/73  Pulse: (!) 108 (!) 105 (!) 110 (!) 108  Resp:  18 20 20   Temp: 97.6 F (36.4 C) 98.2 F (36.8 C) 98.6 F (37 C) 98.6 F (37 C)  TempSrc: Oral Oral Oral Oral  SpO2: 100% 100% 100% 100%  Weight:      Height:        Intake/Output Summary (Last 24 hours) at 02/18/2018 1503 Last data filed at 02/18/2018 1419 Gross per 24 hour  Intake 2961.22 ml  Output 500 ml  Net 2461.22 ml   Filed Weights   02/17/18 1412  02/17/18 2142  Weight: 103.4 kg 120.2 kg   Body mass index is 53.54 kg/m.  General:  obese AAF, in no acute distress HEENT: normal Lymph: no adenopathy Neck: no JVD Endocrine:  No thryomegaly Vascular: No carotid bruits; FA pulses 2+ bilaterally without bruits  Cardiac:  normal S1, S2; regular rhythm, tachy rate; no murmur  Lungs:  clear to auscultation bilaterally, no wheezing, rhonchi or rales  Abd: soft, nontender, no hepatomegaly  Ext: no edema Musculoskeletal:  No deformities, BUE and BLE strength normal and equal Skin: warm and dry  Neuro:  CNs 2-12 intact, no focal abnormalities noted Psych:  Normal affect   EKG:  The EKG was personally reviewed and demonstrates:  Sinus tach 125 with inferior ST abnormalities, no prior for comparison.  Telemetry:  Telemetry was personally reviewed and demonstrates:  Sinus tach low 100s  Relevant CV Studies: None   Laboratory Data:  Chemistry Recent Labs  Lab 02/17/18 1507 02/17/18 2226 02/18/18 0434  NA 138  --  140  K 3.4*  --  3.6  CL 104  --  111  CO2 26  --  21*  GLUCOSE 130*  --  107*  BUN 12  --  9  CREATININE 0.83 0.75 0.65  CALCIUM 9.0  --  8.3*  GFRNONAA >60 >60 >60  GFRAA >60 >60 >60  ANIONGAP 8  --  8    Recent Labs  Lab 02/17/18 1507  PROT 7.3  ALBUMIN 3.7  AST 24  ALT 21  ALKPHOS 71  BILITOT 0.6   Hematology Recent Labs  Lab 02/17/18 1507 02/17/18 2226  02/18/18 0434  WBC 7.5 8.2 7.6  RBC 4.64 4.27 4.19  HGB 10.8* 9.8* 9.4*  HCT 35.5* 32.2* 32.0*  MCV 76.5* 75.4* 76.4*  MCH 23.3* 23.0* 22.4*  MCHC 30.4 30.4 29.4*  RDW 17.9* 18.0* 18.1*  PLT 381 367 370   Cardiac Enzymes Recent Labs  Lab 02/17/18 1507 02/17/18 2226 02/18/18 0434 02/18/18 1002  TROPONINI <0.03 <0.03 <0.03 <0.03   No results for input(s): TROPIPOC in the last 168 hours.  BNPNo results for input(s): BNP, PROBNP in the last 168 hours.  DDimer  Recent Labs  Lab 02/17/18 1507  DDIMER 0.33    Radiology/Studies:  Dg Chest 2 View  Result Date: 02/17/2018 CLINICAL DATA:  Chest tightness, body aches EXAM: CHEST - 2 VIEW COMPARISON:  None. FINDINGS: The heart size and mediastinal contours are within normal limits. Both lungs are clear. The visualized skeletal structures are unremarkable. IMPRESSION: No active cardiopulmonary disease. Electronically Signed   By: Elige Ko   On: 02/17/2018 15:32   US Abdomen Complete  Result Date: 02/18/2018 CLINICAL DATA:  Abdominal pain for 1 month EXAM: ABDOMEN ULTRASOUND COMPLETE COMPARISON:  None. FINDINGS: Gallbladder: No gallstones or wall thickening visualized. No sonographic Murphy sign noted by sonographer. Common bile duct: Diameter: 3.7 mm Liver: No focal lesion identified. There is diffuse increased echotexture of the liver. Portal vein is patent on color Doppler imaging with normal direction of blood flow towards the liver. IVC: No abnormality visualized. Pancreas: Visualized portion unremarkable. Spleen: Size and appearance within normal limits. Right Kidney: Length: 12.6 cm. Echogenicity within normal limits. No mass or hydronephrosis visualized. Left Kidney: Length: 12.6 cm. Echogenicity within normal limits. No mass or hydronephrosis visualized. Abdominal aorta: No aneurysm visualized. Other findings: None. IMPRESSION: No acute abnormality identified. Diffuse increased echotexture of the liver, nonspecific but can be seen  in fatty infiltration of liver. Electronically Signed  By: Sherian Rein M.D.   On: 02/18/2018 08:07    Assessment and Plan:   Siedah Sedor is a 37 y.o. obese AA female with a hx of treated HTN, prediabetes, family h/o heart disease (mother w/ MI in her early 12s, maternal grandmother with CAD s/p CABG in her 45s), ?OSA and microcytic hypochromic anemia  who is being seen today for the evaluation of chest pain and palpitations at the request of Dr. Benjamine Mola, Internal Medicine.   1. Chest Pain: mixed atypical and typical features, but mostly atypical. D-dimer is normal, ruling out possibility of PE. Her cardiac enzymes are negative x 3. She was noted to have inferior Twave abnormalities when in sinus tach with HR of 125. No prior EKGs for comparison. Her cardiac risk factors include HTN and family h/o CAD, as noted above. Also h/o obesity and prediabetes. It is possible that her recent CP may be secondary to symptomatic anemia, but given her abnormal EKG and cardiac risk factors, we should consider ischemic evaluation, but this can be done as an outpatient. Given her body habitus and sinus tach, she would not be a good candidate for coronary CTA. She may be a better stress test candidate.   2. Sinus Tach/Palpitations: HR in the low 100s-120s. Urine pregnancy test negative. UA negative for UTI. D-dimer negative, ruling out PE. K WNL. TSH low. Free T3 and T4 pending. Troponin's negative x 3. BMP WNL. CBC shows anemia a/ Hgb of 9.4. Microcytic anemia w/ MCV of 75. H/o menorrhagia + pica cravings including ice and corn starch.  Suspect iron deficiency anemia. She should get further w/u at PCP office and may need to start oral Fe supplementation. May consider changing antihypertensive from lisinopril-HCTZ to a  blocker. Can also check echo. This can be done as outpatient. We can arrange outpatient studies and clinic f/u.    3. Thyroid: TSH is low. Free T3, T4 pending. Primary team to f/u on results.   4.  Anemia: based on history of menorrhagia, MVC of 75 and pica cravings, suspect IDA. Needs f/u with PCP for further w/u and treatment.   5. HTN: on lisinopril-HCTZ as outpatient. Given tachycardia and concerns regarding thyroid dysfunction, recommend initiation of a  blocker    For questions or updates, please contact [facility] Please consult [email or web address] for contact info under     Signed, Robbie Lis, PA-C  02/18/2018 3:03 PM  I have seen and examined the patient along with Robbie Lis, PA-C .  I have reviewed the chart, notes and new data.  I agree with PA/NP's note.  Key new complaints: chest pain sd is highly atypical, not esertional Key examination changes: morbid obesity limits her exam, but no obvious CV abnormalities other than tachycardia Key new findings / data: ECG suggests LVH w voltage masked by obesity, although cannot entirely exclude ischemia. Normal troponin x 3. Normal size heart on CXR w/o CHF changes, narrow mediastinum, suppressed TSH with normal FT4, moderate microcytic anemia.  PLAN: Doubt acute coronary sd. Recommend outpatient echo to evaluate for LVH and look for wall motion abn. Will schedule for OP Lexiscan Myoview - will get much better images with upright camera, since soft tissue attenuation will be a problem. She is not a candidate for coronary CTA due to size and fast HR. F/U after tests. Add beta blocker- this will help if she has true hyperthyroidism. Cut Lisinopril HCT in half.  Thurmon Fair, MD, [facility] [phone] 02/18/2018, 4:24 PM

## 2018-02-18 NOTE — Telephone Encounter (Signed)
-----   Message from [place] sent at 02/18/2018 10:47 AM EDT ----- Regarding: Hospital Referral Patient is getting discharged from the hospital today. Nurse stated she needed to be set up with an endocrinologist for her Thyroid .  Could you review her chart and let me know if I can go ahead and schedule her an appointment.  Thanks

## 2018-02-18 NOTE — Discharge Summary (Signed)
Physician Discharge Summary  Lisa Mcpherson YQM:[id number] DOB: [birth year] DOA: 02/17/2018  PCP: [facility]  Admit date: 02/17/2018 Discharge date: 02/18/2018  Time spent: 45 minutes  Recommendations for Outpatient Follow-up:  1. Dr George Hugh office will contact patient to schedule appointment for follow up on subclinical hyperthyroid and anemia 2. Dr Rubie Maid office will contact patient to schedule OP echo and OP stress test.    Discharge Diagnoses:  Principal Problem:   Chest pain Active Problems:   Tachycardia   Essential hypertension   Anemia   Low TSH level   Discharge Condition: stable  Diet recommendation: heart healthy  Filed Weights   02/17/18 1412 02/17/18 2142  Weight: 103.4 kg 120.2 kg    History of present illness:  Lisa Mcpherson is a 37 y.o. female with history of hypertension, microcytic hypochromic anemia presented to the ER 10/10 in [facility] with complaints of chest pain and palpitation.  Patient symptoms started 2 days prior with chest pain which was retrosternal occurring multiple times a day even at rest.  Had some nausea denied any vomiting had some right upper quadrant pain which has been ongoing for last 1 month.  Denied any diarrhea.  patient developed palpitation denied any diaphoresis or shortness of breath.  Had been taking lisinopril for blood pressure for last 2 to 3 years.  Has not had any associated dizziness or loss of consciousness  Hospital Course:  1. Chest pain with tachycardia and palpitations -d-dimer is negative. Troponin neg x3. EKG with ST at 125 with inferior ST abnormalities. TSH is low and check free T4 normal with  free T3 pending at discharge indicating subclinical hyperthyroid. Contacted [facility] and Dr Henderson Cloud office will contact to schedule f/u appoitment. Also evaluated by cardiology who opine not likely acute coronary sd. Recommended OP echo and stress test. Added beta blocker. Discontinued HCTZ.  Decreased lisinopril dose. Patient pain free with HR 103 at discharge 2. Right upper quadrant tenderness. RUQ [place] negative for gallbladder pathology but offers diffuse increased echotexture of liver nonspecific but can be seen in fatty infiltration of liver. LFT's within the limits of normal. OP follow up with PCP 3. History of hypertension on lisinopril and HCTZ. BP low end of normal while in hospital. Discontinue HCTZ continue lisinopril at lower dose and add metoprolol 4. Microcytic hypochromic anemia appears to be chronic. Reports heavy periods. Hg stable at 9.4. Of note Hg 11.0 11/2016. Anemia panel pending. No s/sx active bleeding. OP follow up  Procedures:  Consultations:  Dr croitoru cardiology  Discharge Exam: Vitals:   02/18/18 0840 02/18/18 1222  BP: 108/67 113/73  Pulse: (!) 110 (!) 108  Resp: 20 20  Temp: 98.6 F (37 C) 98.6 F (37 C)  SpO2: 100% 100%    General: obese in no acute distress Cardiovascular: tachycardia but regular no MGR  Respiratory: normal effort BS clear bilaterally   Discharge Instructions   Discharge Instructions    Call MD for:  difficulty breathing, headache or visual disturbances   Complete by:  As directed    Call MD for:  persistant dizziness or light-headedness   Complete by:  As directed    Call MD for:  severe uncontrolled pain   Complete by:  As directed    Call MD for:  temperature >100.4   Complete by:  As directed    Diet - low sodium heart healthy   Complete by:  As directed    Discharge instructions   Complete by:  As  directed    Take medications as prescribed Follow up with cardiology for OP stress test and OP echo Follow up with PCP 1-2 weeks for monitoring of anemia   Increase activity slowly   Complete by:  As directed      Allergies as of 02/18/2018   No Known Allergies     Medication List    STOP taking these medications   HYDROcodone-acetaminophen 5-325 MG tablet Commonly known as:  NORCO/VICODIN    lisinopril-hydrochlorothiazide 20-12.5 MG tablet Commonly known as:  PRINZIDE,ZESTORETIC   phenazopyridine 200 MG tablet Commonly known as:  PYRIDIUM   promethazine 25 MG tablet Commonly known as:  PHENERGAN     TAKE these medications   LINZESS 290 MCG Caps capsule Generic drug:  linaclotide Take 290 mcg by mouth daily as needed (constipation).   lisinopril 10 MG tablet Commonly known as:  PRINIVIL,ZESTRIL Take 1 tablet (10 mg total) by mouth daily.   metoprolol succinate 25 MG 24 hr tablet Commonly known as:  TOPROL-XL Take 1 tablet (25 mg total) by mouth daily. Start taking on:  02/19/2018      No Known Allergies Follow-up Information    Croitoru, Mihai, MD Follow up.   Specialty:  Cardiology Why:  our office will call you with appointments for your stress test and ultrasound of your heart.  you will also get an appointment to see the Cardiologist or his PA/NP after your studies have been completed.  Contact information: [address] [place] [address] [phone]            The results of significant diagnostics from this hospitalization (including imaging, microbiology, ancillary and laboratory) are listed below for reference.    Significant Diagnostic Studies: Dg Chest 2 View  Result Date: 02/17/2018 CLINICAL DATA:  Chest tightness, body aches EXAM: CHEST - 2 VIEW COMPARISON:  None. FINDINGS: The heart size and mediastinal contours are within normal limits. Both lungs are clear. The visualized skeletal structures are unremarkable. IMPRESSION: No active cardiopulmonary disease. Electronically Signed   By: Elige Ko   On: 02/17/2018 15:32   US Abdomen Complete  Result Date: 02/18/2018 CLINICAL DATA:  Abdominal pain for 1 month EXAM: ABDOMEN ULTRASOUND COMPLETE COMPARISON:  None. FINDINGS: Gallbladder: No gallstones or wall thickening visualized. No sonographic Murphy sign noted by sonographer. Common bile duct: Diameter: 3.7 mm Liver:  No focal lesion identified. There is diffuse increased echotexture of the liver. Portal vein is patent on color Doppler imaging with normal direction of blood flow towards the liver. IVC: No abnormality visualized. Pancreas: Visualized portion unremarkable. Spleen: Size and appearance within normal limits. Right Kidney: Length: 12.6 cm. Echogenicity within normal limits. No mass or hydronephrosis visualized. Left Kidney: Length: 12.6 cm. Echogenicity within normal limits. No mass or hydronephrosis visualized. Abdominal aorta: No aneurysm visualized. Other findings: None. IMPRESSION: No acute abnormality identified. Diffuse increased echotexture of the liver, nonspecific but can be seen in fatty infiltration of liver. Electronically Signed   By: Sherian Rein M.D.   On: 02/18/2018 08:07    Microbiology: No results found for this or any previous visit (from the past 240 hour(s)).   Labs: Basic Metabolic Panel: Recent Labs  Lab 02/17/18 1507 02/17/18 2226 02/18/18 0434  NA 138  --  140  K 3.4*  --  3.6  CL 104  --  111  CO2 26  --  21*  GLUCOSE 130*  --  107*  BUN 12  --  9  CREATININE 0.83  0.75 0.65  CALCIUM 9.0  --  8.3*   Liver Function Tests: Recent Labs  Lab 02/17/18 1507  AST 24  ALT 21  ALKPHOS 71  BILITOT 0.6  PROT 7.3  ALBUMIN 3.7   No results for input(s): LIPASE, AMYLASE in the last 168 hours. No results for input(s): AMMONIA in the last 168 hours. CBC: Recent Labs  Lab 02/17/18 1507 02/17/18 2226 02/18/18 0434  WBC 7.5 8.2 7.6  NEUTROABS 4.0  --   --   HGB 10.8* 9.8* 9.4*  HCT 35.5* 32.2* 32.0*  MCV 76.5* 75.4* 76.4*  PLT 381 367 370   Cardiac Enzymes: Recent Labs  Lab 02/17/18 1507 02/17/18 2226 02/18/18 0434 02/18/18 1002  TROPONINI <0.03 <0.03 <0.03 <0.03   BNP: BNP (last 3 results) No results for input(s): BNP in the last 8760 hours.  ProBNP (last 3 results) No results for input(s): PROBNP in the last 8760 hours.  CBG: Recent Labs  Lab  02/17/18 1445  GLUCAP 188*       Signed:  Gwenyth Bender MD.  [facility] 02/18/2018, 4:50 PM

## 2018-02-18 NOTE — Progress Notes (Signed)
Patient discharged: Home with family  Via: Wheelchair   Discharge paperwork given: to patient and family  Reviewed with teach back  IV and telemetry disconnected  Belongings given to patient    

## 2018-02-18 NOTE — Telephone Encounter (Signed)
Next available medicaid appt

## 2018-02-18 NOTE — Progress Notes (Signed)
Orders for outpatient stress test and echo placed. Our office will call pt with appt times and will arrange clinic f/u with Dr. Royann Shivers.

## 2018-02-19 LAB — T3, FREE: T3 FREE: 8.9 pg/mL — AB (ref 2.0–4.4)

## 2018-02-21 NOTE — Telephone Encounter (Signed)
Please schedule pt

## 2018-02-21 NOTE — Telephone Encounter (Signed)
Sorry, we have just a limited medicaid availability

## 2018-02-25 ENCOUNTER — Encounter: Payer: Self-pay | Admitting: Cardiology

## 2018-03-08 ENCOUNTER — Telehealth: Payer: Self-pay

## 2018-03-08 NOTE — Telephone Encounter (Signed)
New message     Just an FYI. We have made several attempts to contact this patient including sending a letter to schedule or reschedule their echocardiogram & MYOCARDIAL PERFUSION. We will be removing the patient from the WQ.   Thank you

## 2018-03-08 NOTE — Telephone Encounter (Signed)
According to the discharge note from the hospital, it appears this pt is to follow at our [facility] location.  Can you defer to their triage pool.  Thanks!

## 2018-03-30 ENCOUNTER — Encounter: Payer: Self-pay | Admitting: Cardiology

## 2018-04-28 ENCOUNTER — Telehealth: Payer: Self-pay

## 2018-04-28 NOTE — Telephone Encounter (Signed)
SENT REFERRAL TO SCHEDULING AND FILED NOTES 

## 2018-05-03 ENCOUNTER — Encounter: Payer: Self-pay | Admitting: Cardiology

## 2018-05-03 ENCOUNTER — Ambulatory Visit (INDEPENDENT_AMBULATORY_CARE_PROVIDER_SITE_OTHER): Payer: Self-pay | Admitting: Cardiology

## 2018-05-03 VITALS — BP 124/80 | HR 91 | Ht 59.0 in | Wt 268.0 lb

## 2018-05-03 DIAGNOSIS — R0789 Other chest pain: Secondary | ICD-10-CM

## 2018-05-03 DIAGNOSIS — I10 Essential (primary) hypertension: Secondary | ICD-10-CM

## 2018-05-03 HISTORY — DX: Morbid (severe) obesity due to excess calories: E66.01

## 2018-05-03 HISTORY — DX: Other chest pain: R07.89

## 2018-05-03 NOTE — Progress Notes (Signed)
Cardiology Office Note:    Date:  05/03/2018   ID:  Lisa Mcpherson, DOB 11/01/1980, MRN [id number]  PCP:  [facility]  Cardiologist:  Garwin Brothersajan R Revankar, MD   Referring MD: Claybon JabsBrown, Emily, PA-C    ASSESSMENT:    1. Chest discomfort   2. Essential hypertension   3. Morbid obesity (HCC)    PLAN:    In order of problems listed above:  1. Primary prevention stressed with the patient.  Importance of compliance with diet and medication stressed and she vocalized understanding.  Her blood pressure is stable.  Diet was discussed for obesity and risks of obesity explained and she vocalized understanding. 2. In view of her symptoms which are atypical I will set her up for an exercise stress echo.  She knows to go to the nearest emergency room for any concerning symptoms. 3. Patient will be seen in follow-up appointment in 6 months or earlier if the patient has any concerns    Medication Adjustments/Labs and Tests Ordered: Current medicines are reviewed at length with the patient today.  Concerns regarding medicines are outlined above.  No orders of the defined types were placed in this encounter.  No orders of the defined types were placed in this encounter.    History of Present Illness:    Lisa Mcpherson is a 37 y.o. female who is being seen today for the evaluation of chest discomfort at the request of Claybon JabsBrown, Emily, PA-C.  Patient is a pleasant 37 year old female.  She has past medical history of essential hypertension and morbid obesity.  She mentions to me that she leads a very sedentary lifestyle.  She is found to the emergency room with chest discomfort-like symptoms.  She does not exercise on a regular basis.  He is sent here for evaluation of those symptoms.  At the time of my evaluation, the patient is alert awake oriented and in no distress.  The patient mentions to me that these sensations are stabbing in nature and occur only transiently and not related to  exertion.  Past Medical History:  Diagnosis Date  . Amenorrhea   . Anemia   . Arm numbness   . Chlamydia infection   . Cough   . Diabetes (HCC)   . Dysuria   . Fatigue   . Fatty liver   . Hypertension   . Low TSH level   . Obesity   . Prediabetes   . Sinusitis   . SOB (shortness of breath)   . Trichomonosis   . UTI (urinary tract infection)   . Vaginal discharge     Past Surgical History:  Procedure Laterality Date  . CESAREAN SECTION      Current Medications: Current Meds  Medication Sig  . linaclotide (LINZESS) 290 MCG CAPS capsule Take 290 mcg by mouth daily as needed (constipation).   Marland Kitchen. lisinopril (PRINIVIL,ZESTRIL) 10 MG tablet Take 1 tablet (10 mg total) by mouth daily.     Allergies:   Patient has no known allergies.   Social History   Socioeconomic History  . Marital status: Single    Spouse name: Not on file  . Number of children: Not on file  . Years of education: Not on file  . Highest education level: Not on file  Occupational History  . Not on file  Social Needs  . Financial resource strain: Not on file  . Food insecurity:    Worry: Not on file    Inability: Not on file  .  Transportation needs:    Medical: Not on file    Non-medical: Not on file  Tobacco Use  . Smoking status: Passive Smoke Exposure - Never Smoker  . Smokeless tobacco: Never Used  Substance and Sexual Activity  . Alcohol use: Yes    Comment: weekends  . Drug use: No  . Sexual activity: Not on file  Lifestyle  . Physical activity:    Days per week: Not on file    Minutes per session: Not on file  . Stress: Not on file  Relationships  . Social connections:    Talks on phone: Not on file    Gets together: Not on file    Attends religious service: Not on file    Active member of club or organization: Not on file    Attends meetings of clubs or organizations: Not on file    Relationship status: Not on file  Other Topics Concern  . Not on file  Social History  Narrative  . Not on file     Family History: The patient's family history includes CAD in her mother. There is no history of Stroke.  ROS:   Please see the history of present illness.    All other systems reviewed and are negative.  EKGs/Labs/Other Studies Reviewed:    The following studies were reviewed today: EKG reveals sinus rhythm and nonspecific ST-T changes.   Recent Labs: 02/17/2018: ALT 21; TSH 0.037 02/18/2018: BUN 9; Creatinine, Ser 0.65; Hemoglobin 9.4; Platelets 370; Potassium 3.6; Sodium 140  Recent Lipid Panel No results found for: CHOL, TRIG, HDL, CHOLHDL, VLDL, LDLCALC, LDLDIRECT  Physical Exam:    VS:  BP 124/80 (BP Location: Right Arm, Patient Position: Sitting, Cuff Size: Normal)   Pulse 91   Ht 4\' 11"  (1.499 m)   Wt 268 lb (121.6 kg)   SpO2 98%   BMI 54.13 kg/m     Wt Readings from Last 3 Encounters:  05/03/18 268 lb (121.6 kg)  02/17/18 265 lb 1.6 oz (120.2 kg)  11/14/16 230 lb (104.3 kg)     GEN: Patient is in no acute distress HEENT: Normal NECK: No JVD; No carotid bruits LYMPHATICS: No lymphadenopathy CARDIAC: S1 S2 regular, 2/6 systolic murmur at the apex. RESPIRATORY:  Clear to auscultation without rales, wheezing or rhonchi  ABDOMEN: Soft, non-tender, non-distended MUSCULOSKELETAL:  No edema; No deformity  SKIN: Warm and dry NEUROLOGIC:  Alert and oriented x 3 PSYCHIATRIC:  Normal affect    Signed, Garwin Brothersajan R Revankar, MD  05/03/2018 11:20 AM    [facility]

## 2018-05-03 NOTE — Patient Instructions (Signed)
Medication Instructions:  Your physician recommends that you continue on your current medications as directed. Please refer to the Current Medication list given to you today.  If you need a refill on your cardiac medications before your next appointment, please call your pharmacy.   Lab work: None  If you have labs (blood work) drawn today and your tests are completely normal, you will receive your results only by: Lisa Mcpherson. MyChart Message (if you have MyChart) OR . A paper copy in the mail If you have any lab test that is abnormal or we need to change your treatment, we will call you to review the results.  Testing/Procedures: Your physician has requested that you have a stress echocardiogram. For further information please visit [email or web address]. Please follow instruction sheet as given.  Follow-Up: At [facility], you and your health needs are our priority.  As part of our continuing mission to provide you with exceptional heart care, we have created designated Provider Care Teams.  These Care Teams include your primary Cardiologist (physician) and Advanced Practice Providers (APPs -  Physician Assistants and Nurse Practitioners) who all work together to provide you with the care you need, when you need it. You will need a follow up appointment in 6 months.  Please call our office 2 months in advance to schedule this appointment.      Exercise Stress Test An exercise stress test is a test to check how your heart works during exercise. You will need to walk on a treadmill or ride an exercise bike for this test. An electrocardiogram (ECG) will record your heartbeat when you are at rest and when you are exercising. You may have an ultrasound or nuclear test after the exercise test. The test is done to check for coronary artery disease (CAD). It is also done to:  See how well you can exercise.  Watch for high blood pressure during exercise.  Test how well you can exercise after  treatment.  Check the blood flow to your arms and legs. If your test result is not normal, more testing may be needed. What happens before the procedure?  Follow instructions from your doctor about what you cannot eat or drink. ? Do not have any drinks or foods that have caffeine in them for 24 hours before the test, or as told by your doctor. This includes coffee, tea (even decaf tea), sodas, chocolate, and cocoa.  Ask your doctor about changing or stopping your normal medicines. This is important if you: ? Take diabetes medicines. ? Take beta-blocker medicines. ? Wear a nitroglycerin patch.  If you use an inhaler, bring it with you to the test.  Do not put lotions, powders, creams, or oils on your chest before the test.  Wear comfortable shoes and clothing.  Do not use any products that have nicotine or tobacco in them, such as cigarettes and e-cigarettes. Stop using them at least 4 hours before the test. If you need help quitting, ask your doctor. What happens during the procedure?  Patches (electrodes) will be put on your chest.  Wires will be connected to the patches. The wires will send signals to a machine to record your heartbeat.  Your heart rate will be watched while you are resting and while you are exercising. Your blood pressure will also be watched during the test.  You will walk on a treadmill or use a stationary bike. If you cannot use these, you may be asked to turn a crank with your hands.  The activity will get harder and will raise your heart rate.  You may be asked to breathe into a tube a few times during the test. This measures the gases that you breathe out.  You will be asked how you are feeling throughout the test.  You will exercise until your heart reaches a target heart rate. You will stop early if: ? You feel dizzy. ? You have chest pain. ? You are out of breath. ? Your blood pressure is too high or too low. ? You have an irregular  heartbeat. ? You have pain or aching in your arms or legs. The procedure may vary among doctors and hospitals. What happens after the procedure?  Your blood pressure, heart rate, breathing rate, and blood oxygen level will be watched after the test.  You may return to your normal diet and activities as told by your doctor.  It is up to you to get the results of your test. Ask your doctor, or the department that is doing the test, when your results will be ready. Summary  An exercise stress test is a test to check how your heart works during exercise.  This test is done to check for coronary artery disease.  Your heart rate will be watched while you are resting and while you are exercising.  Follow instructions from your doctor about what you cannot eat or drink before the test. This information is not intended to replace advice given to you by your health care provider. Make sure you discuss any questions you have with your health care provider. Document Released: 10/14/2007 Document Revised: 07/28/2016 Document Reviewed: 07/28/2016 Elsevier Interactive Patient Education  2019 [employer].

## 2018-05-03 NOTE — Addendum Note (Signed)
Addended by: Crist FatLOCKHART, CATHERINE P on: 05/03/2018 11:35 AM   Modules accepted: Orders

## 2018-05-12 ENCOUNTER — Ambulatory Visit (HOSPITAL_BASED_OUTPATIENT_CLINIC_OR_DEPARTMENT_OTHER)
Admission: RE | Admit: 2018-05-12 | Disposition: A | Discharge status: Home / Self Care | Payer: Medicaid Other | Source: Ambulatory Visit

## 2018-10-18 ENCOUNTER — Encounter (HOSPITAL_BASED_OUTPATIENT_CLINIC_OR_DEPARTMENT_OTHER): Payer: Self-pay

## 2018-10-18 ENCOUNTER — Other Ambulatory Visit: Payer: Self-pay

## 2018-10-18 ENCOUNTER — Emergency Department (HOSPITAL_BASED_OUTPATIENT_CLINIC_OR_DEPARTMENT_OTHER)
Admission: EM | Admit: 2018-10-18 | Discharge: 2018-10-18 | Disposition: A | Discharge status: Home / Self Care | Payer: Self-pay | Attending: Emergency Medicine | Admitting: Emergency Medicine

## 2018-10-18 ENCOUNTER — Emergency Department (HOSPITAL_BASED_OUTPATIENT_CLINIC_OR_DEPARTMENT_OTHER): Payer: Self-pay

## 2018-10-18 DIAGNOSIS — N760 Acute vaginitis: Secondary | ICD-10-CM | POA: Insufficient documentation

## 2018-10-18 DIAGNOSIS — Z79899 Other long term (current) drug therapy: Secondary | ICD-10-CM | POA: Insufficient documentation

## 2018-10-18 DIAGNOSIS — D509 Iron deficiency anemia, unspecified: Secondary | ICD-10-CM | POA: Insufficient documentation

## 2018-10-18 DIAGNOSIS — Z7722 Contact with and (suspected) exposure to environmental tobacco smoke (acute) (chronic): Secondary | ICD-10-CM | POA: Insufficient documentation

## 2018-10-18 DIAGNOSIS — Z3202 Encounter for pregnancy test, result negative: Secondary | ICD-10-CM | POA: Insufficient documentation

## 2018-10-18 DIAGNOSIS — E119 Type 2 diabetes mellitus without complications: Secondary | ICD-10-CM | POA: Insufficient documentation

## 2018-10-18 DIAGNOSIS — I10 Essential (primary) hypertension: Secondary | ICD-10-CM | POA: Insufficient documentation

## 2018-10-18 DIAGNOSIS — R1032 Left lower quadrant pain: Secondary | ICD-10-CM

## 2018-10-18 DIAGNOSIS — B9689 Other specified bacterial agents as the cause of diseases classified elsewhere: Secondary | ICD-10-CM

## 2018-10-18 LAB — URINALYSIS, ROUTINE W REFLEX MICROSCOPIC
Bilirubin Urine: NEGATIVE
Glucose, UA: NEGATIVE mg/dL
Hgb urine dipstick: NEGATIVE
Ketones, ur: NEGATIVE mg/dL
Nitrite: NEGATIVE
Protein, ur: NEGATIVE mg/dL
Specific Gravity, Urine: 1.02 (ref 1.005–1.030)
pH: 7.5 (ref 5.0–8.0)

## 2018-10-18 LAB — TROPONIN I: Troponin I: <0.03 ng/mL (ref <0.03)

## 2018-10-18 LAB — COMPREHENSIVE METABOLIC PANEL
ALT: 21 U/L (ref 0–44)
AST: 23 U/L (ref 15–41)
Albumin: 3.9 g/dL (ref 3.5–5.0)
Alkaline Phosphatase: 74 U/L (ref 38–126)
Anion gap: 8 (ref 5–15)
BUN: 9 mg/dL (ref 6–20)
CO2: 23 mmol/L (ref 22–32)
Calcium: 9.2 mg/dL (ref 8.9–10.3)
Chloride: 105 mmol/L (ref 98–111)
Creatinine, Ser: 0.99 mg/dL (ref 0.44–1.00)
GFR calc Af Amer: >60 mL/min (ref >60)
GFR calc non Af Amer: >60 mL/min (ref >60)
Glucose, Bld: 123 mg/dL — ABNORMAL HIGH (ref 70–99)
Potassium: 3.7 mmol/L (ref 3.5–5.1)
Sodium: 136 mmol/L (ref 135–145)
Total Bilirubin: 0.3 mg/dL (ref 0.3–1.2)
Total Protein: 7.4 g/dL (ref 6.5–8.1)

## 2018-10-18 LAB — URINALYSIS, MICROSCOPIC (REFLEX)

## 2018-10-18 LAB — CBC
HCT: 37.6 % (ref 36.0–46.0)
Hemoglobin: 11.1 g/dL — ABNORMAL LOW (ref 12.0–15.0)
MCH: 22.3 pg — ABNORMAL LOW (ref 26.0–34.0)
MCHC: 29.5 g/dL — ABNORMAL LOW (ref 30.0–36.0)
MCV: 75.5 fL — ABNORMAL LOW (ref 80.0–100.0)
Platelets: 413 10*3/uL — ABNORMAL HIGH (ref 150–400)
RBC: 4.98 MIL/uL (ref 3.87–5.11)
RDW: 18.1 % — ABNORMAL HIGH (ref 11.5–15.5)
WBC: 11.8 10*3/uL — ABNORMAL HIGH (ref 4.0–10.5)
nRBC: 0 % (ref 0.0–0.2)

## 2018-10-18 LAB — PREGNANCY, URINE: Preg Test, Ur: NEGATIVE

## 2018-10-18 LAB — WET PREP, GENITAL
Sperm: NONE SEEN
Trich, Wet Prep: NONE SEEN
Yeast Wet Prep HPF POC: NONE SEEN

## 2018-10-18 LAB — LIPASE, BLOOD: Lipase: 32 U/L (ref 11–51)

## 2018-10-18 MED ORDER — IBUPROFEN 400 MG PO TABS
600.0000 mg | ORAL_TABLET | Freq: Once | ORAL | Status: AC
  Administered 2018-10-18: 600 mg via ORAL
  Filled 2018-10-18: qty 1

## 2018-10-18 MED ORDER — MORPHINE SULFATE (PF) 4 MG/ML IV SOLN
4.0000 mg | Freq: Once | INTRAVENOUS | Status: AC
  Administered 2018-10-18: 4 mg via INTRAVENOUS
  Filled 2018-10-18: qty 1

## 2018-10-18 MED ORDER — IOHEXOL 300 MG/ML  SOLN
100.0000 mL | Freq: Once | INTRAMUSCULAR | Status: AC | PRN
  Administered 2018-10-18: 100 mL via INTRAVENOUS

## 2018-10-18 MED ORDER — METRONIDAZOLE 500 MG PO TABS: 500.0000 mg | ORAL_TABLET | Freq: Two times a day (BID) | ORAL | 0 refills | Status: DC

## 2018-10-18 NOTE — ED Notes (Signed)
Pt needs another urine sample for culture due to inadequate volume of sample

## 2018-10-18 NOTE — ED Notes (Signed)
Pt c/o left sided pain to arm/abdomen/leg.

## 2018-10-18 NOTE — ED Notes (Signed)
Pt on monitor 

## 2018-10-18 NOTE — ED Notes (Signed)
Patient transported to CT 

## 2018-10-18 NOTE — Discharge Instructions (Signed)
Please see the information and instructions below regarding your visit.  Your diagnoses today include:  1. Left groin pain   2. Bacterial vaginosis   3. Microcytic anemia     Tests performed today include: See side panel of your discharge paperwork for testing performed today. Vital signs are listed at the bottom of these instructions.   Your gonorrhea, chlamydia HIV and syphilis testing will be available in approximately 24 to 48 hours.  He can follow this on my chart.  You be called for any positive results.  You will not receive a call for any negative results.  Medications prescribed:    Take any prescribed medications only as prescribed, and any over the counter medications only as directed on the packaging.  Flagyl is an antibiotic used to treat bacterial vaginosis. This medication CANNOT be taken with alcohol, because it can cause nausea and vomiting combined with alcohol. Please also refrain from drinking alcohol for 48 hours after you finish the medicine.  You are prescribed ibuprofen, a non-steroidal anti-inflammatory agent (NSAID) for pain. You may take 600 mg every 6 hours as needed for pain. If still requiring this medication around the clock for acute pain after 10 days, please see your primary healthcare provider.  Women who are pregnant, breastfeeding, or planning on becoming pregnant should not take non-steroidal anti-inflammatories such as Advil and Aleve. Tylenol is a safe over the counter pain reliever in pregnant women.  You may combine this medication with Tylenol, 650 mg every 6 hours, so you are receiving something for pain every 3 hours.  This is not a long-term medication unless under the care and direction of your primary provider. Taking this medication long-term and not under the supervision of a healthcare provider could increase the risk of stomach ulcers, kidney problems, and cardiovascular problems such as high blood pressure.   Home care instructions:    Please follow any educational materials contained in this packet.   Follow-up instructions: Please follow-up with your primary care provider in one week for further evaluation of your symptoms if they are not completely improved.   Return instructions:  Please return to the Emergency Department if you experience worsening symptoms.  Please come back to the emergency department develop any worsening pain, nausea vomiting prevention keep anything down, or new or worsening symptoms. Please return if you have any other emergent concerns.  Additional Information:   Your vital signs today were: BP (!) 143/74    Pulse 97    Temp 98.5 F (36.9 C) (Oral)    Resp 14    Ht 4\' 11"  (1.499 m)    Wt 117.9 kg    LMP 09/28/2018    SpO2 100%    BMI 52.51 kg/m  If your blood pressure (BP) was elevated on multiple readings during this visit above 130 for the top number or above 80 for the bottom number, please have this repeated by your primary care provider within one month. --------------  Thank you for allowing [place] to participate in your care today.

## 2018-10-18 NOTE — ED Triage Notes (Signed)
Pt c/o LLQ abd pain that started last PM. Pt states her urine is cloudy. Pt also states she has had intermittent CP. Denies ShOB, nausea, dysuria. Pt describes the intermittent CP as sharp and only lasts briefly. Pt c/o tenderness underneath L breast. Pt also reports "numbness" to L side. BEFAST exam negative.

## 2018-10-18 NOTE — ED Provider Notes (Signed)
[facility] EMERGENCY DEPARTMENT Provider Note   CSN: [id number] Arrival date & time: 10/18/18  1750    History   Chief Complaint Chief Complaint  Patient presents with  . Abdominal Pain    HPI Lisa Mcpherson is a 38 y.o. female.     HPI  Patient is a 38 yo female with a PMH of hypertension, obesity, fatty liver, arm numbness, vaginal discharge presenting for left groin and left lower quadrant pain, as well as sensation of "tingling" of her upper arm and left upper leg.  Patient ports that the abdominal pain is her primary concern and it began yesterday morning.  She reports that it was insidious onset.  She denies dysuria, urgency, frequency, vaginal discharge, vaginal bleeding.  Patient reports that she is sexually active with one female partner.  She does not believe she may have been exposed to STI.  Patient reports that the left upper extremity tingling has been an ongoing issue.  It is primarily over her deltoid region.  Patient reports that the sensation of burning and tingling of her left anterior thigh began after she developed the pain in her left lower quadrant in the groin.  She does not have a history of this sensation.  Patient denies any nausea, vomiting, diarrhea or constipation.  She is not sure of her last bowel movement but she is passing gas.  Patient denies any fevers or chills.  Past Medical History:  Diagnosis Date  . Amenorrhea   . Anemia   . Arm numbness   . Chlamydia infection   . Cough   . Diabetes (HCC)   . Dysuria   . Fatigue   . Fatty liver   . Hypertension   . Low TSH level   . Obesity   . Prediabetes   . Sinusitis   . SOB (shortness of breath)   . Trichomonosis   . UTI (urinary tract infection)   . Vaginal discharge     Patient Active Problem List   Diagnosis Date Noted  . Chest discomfort 05/03/2018  . Morbid obesity (HCC) 05/03/2018  . Anemia 02/18/2018  . Low TSH level   . Tachycardia 02/17/2018  . Chest pain 02/17/2018   . Essential hypertension 02/17/2018    Past Surgical History:  Procedure Laterality Date  . CESAREAN SECTION       OB History   No obstetric history on file.      Home Medications    Prior to Admission medications   Medication Sig Start Date End Date Taking? Authorizing Provider  linaclotide (LINZESS) 290 MCG CAPS capsule Take 290 mcg by mouth daily as needed (constipation).     [provider]  lisinopril (PRINIVIL,ZESTRIL) 10 MG tablet Take 1 tablet (10 mg total) by mouth daily. 02/18/18 02/18/19  Gwenyth BenderBlack, Karen M, NP  metoprolol succinate (TOPROL-XL) 25 MG 24 hr tablet Take 1 tablet (25 mg total) by mouth daily. Patient not taking: Reported on 05/03/2018 02/19/18   Gwenyth BenderBlack, Karen M, NP    Family History Family History  Problem Relation Age of Onset  . CAD Mother   . Stroke Neg Hx     Social History Social History   Tobacco Use  . Smoking status: Passive Smoke Exposure - Never Smoker  . Smokeless tobacco: Never Used  Substance Use Topics  . Alcohol use: Yes    Comment: weekends  . Drug use: No     Allergies   Patient has no known allergies.   Review of  Systems Review of Systems  Constitutional: Negative for chills and fever.  HENT: Negative for congestion and sore throat.   Eyes: Negative for visual disturbance.  Respiratory: Negative for cough, chest tightness and shortness of breath.   Cardiovascular: Negative for chest pain.  Gastrointestinal: Positive for abdominal pain. Negative for diarrhea, nausea and vomiting.  Genitourinary: Negative for difficulty urinating, dysuria, flank pain, frequency, vaginal bleeding, vaginal discharge and vaginal pain.       +Groin pain.  Musculoskeletal: Negative for back pain and myalgias.  Skin: Negative for rash.  Neurological: Negative for dizziness, syncope, light-headedness and headaches.       +Paresthesias.     Physical Exam Updated Vital Signs BP (!) 163/110 (BP Location: Right Arm)   Pulse 97    Temp 98.5 F (36.9 C) (Oral)   Resp 20   Ht 4\' 11"  (1.499 m)   Wt 117.9 kg   LMP 09/28/2018   SpO2 98%   BMI 52.51 kg/m   Physical Exam Vitals signs and nursing note reviewed.  Constitutional:      General: She is not in acute distress.    Appearance: She is well-developed. She is obese.  HENT:     Head: Normocephalic and atraumatic.  Eyes:     Conjunctiva/sclera: Conjunctivae normal.     Pupils: Pupils are equal, round, and reactive to light.  Neck:     Musculoskeletal: Normal range of motion and neck supple.  Cardiovascular:     Rate and Rhythm: Normal rate and regular rhythm.     Heart sounds: S1 normal and S2 normal. No murmur.  Pulmonary:     Effort: Pulmonary effort is normal.     Breath sounds: Normal breath sounds. No wheezing or rales.  Abdominal:     General: There is no distension.     Palpations: Abdomen is soft.     Tenderness: There is abdominal tenderness in the left lower quadrant. There is no guarding or rebound.  Genitourinary:    Comments: Pelvic examination performed with RN chaperone present.  No enlarged lymph nodes the inguinal region.  Vaginal tissue pink and rugated.  Cervix nonerythematous and nonfriable.  Small amount of thin discharge in the vaginal vault. No CMT.  Patient has some tenderness to palpation of the left groin on bimanual exam.  No right adnexal tenderness. Musculoskeletal: Normal range of motion.        General: No deformity.  Lymphadenopathy:     Cervical: No cervical adenopathy.  Skin:    General: Skin is warm and dry.     Findings: No erythema or rash.  Neurological:     Mental Status: She is alert.     Comments: Cranial nerves grossly intact. Patient moves extremities symmetrically and with good coordination.  Psychiatric:        Behavior: Behavior normal.        Thought Content: Thought content normal.        Judgment: Judgment normal.      ED Treatments / Results  Labs (all labs ordered are listed, but only  abnormal results are displayed) Labs Reviewed  COMPREHENSIVE METABOLIC PANEL - Abnormal; Notable for the following components:      Result Value   Glucose, Bld 123 (*)    All other components within normal limits  CBC - Abnormal; Notable for the following components:   WBC 11.8 (*)    Hemoglobin 11.1 (*)    MCV 75.5 (*)    MCH 22.3 (*)  MCHC 29.5 (*)    RDW 18.1 (*)    Platelets 413 (*)    All other components within normal limits  URINALYSIS, ROUTINE W REFLEX MICROSCOPIC - Abnormal; Notable for the following components:   APPearance CLOUDY (*)    Leukocytes,Ua TRACE (*)    All other components within normal limits  URINALYSIS, MICROSCOPIC (REFLEX) - Abnormal; Notable for the following components:   Bacteria, UA MANY (*)    All other components within normal limits  URINE CULTURE  WET PREP, GENITAL  LIPASE, BLOOD  PREGNANCY, URINE  TROPONIN I  RPR  HIV ANTIBODY (ROUTINE TESTING W REFLEX)  GC/CHLAMYDIA PROBE AMP ([place]) NOT AT [facility]    EKG EKG Interpretation  Date/Time:  Tuesday October 18 2018 18:06:50 EDT Ventricular Rate:  93 PR Interval:  126 QRS Duration: 84 QT Interval:  360 QTC Calculation: 447 R Axis:   83 Text Interpretation:  Normal sinus rhythm Normal ECG No significant change since last tracing Confirmed by Deno Etienne [id number]) on 10/18/2018 6:20:21 PM   Radiology Ct Abdomen Pelvis W Contrast  Result Date: 10/18/2018 CLINICAL DATA:  Acute left lower quadrant abdominal pain. EXAM: CT ABDOMEN AND PELVIS WITH CONTRAST TECHNIQUE: Multidetector CT imaging of the abdomen and pelvis was performed using the standard protocol following bolus administration of intravenous contrast. CONTRAST:  115mL OMNIPAQUE IOHEXOL 300 MG/ML  SOLN COMPARISON:  None available currently. FINDINGS: Lower chest: No acute abnormality. Hepatobiliary: No focal liver abnormality is seen. No gallstones, gallbladder wall thickening, or biliary dilatation. Pancreas: Unremarkable. No pancreatic  ductal dilatation or surrounding inflammatory changes. Spleen: Normal in size without focal abnormality. Adrenals/Urinary Tract: Adrenal glands appear normal. Small nonobstructive left renal calculus is noted. No hydronephrosis or renal obstruction is noted. Urinary bladder is unremarkable. Stomach/Bowel: Stomach is within normal limits. Appendix appears normal. No evidence of bowel wall thickening, distention, or inflammatory changes. Vascular/Lymphatic: No significant vascular findings are present. No enlarged abdominal or pelvic lymph nodes. Reproductive: Uterus and bilateral adnexa are unremarkable. Other: No abdominal wall hernia or abnormality. No abdominopelvic ascites. Musculoskeletal: No acute or significant osseous findings. IMPRESSION: Small nonobstructive left renal calculus. No hydronephrosis or renal obstruction is noted. No other significant abnormality seen in the abdomen or pelvis. Electronically Signed   By: Marijo Conception M.D.   On: 10/18/2018 20:44    Procedures Procedures (including critical care time)  Medications Ordered in ED Medications  morphine 4 MG/ML injection 4 mg (4 mg Intravenous Given 10/18/18 1937)  iohexol (OMNIPAQUE) 300 MG/ML solution 100 mL (100 mLs Intravenous Contrast Given 10/18/18 2035)     Initial Impression / Assessment and Plan / ED Course  I have reviewed the triage vital signs and the nursing notes.  Pertinent labs & imaging results that were available during my care of the patient were reviewed by me and considered in my medical decision making (see chart for details).  Clinical Course as of Oct 17 2141  Tue Oct 18, 2018  1914 Bacteria, UA(!): MANY [AM]  1914 Leukocytes,Ua(!): TRACE [AM]  [phone] Patient verbally verified a safe ride from the ED. Proceeded with prescribing morphine for pain/relaxtion/muscle relaxation in the ED.   [AM]  2050 On further questioning of pt, she states that pain began after rough sexual intercourse yesterday. She feels  that her partner may have "injured" something.    [AM]  2052 Not suggestive of infection. Appears contaminated. Will cancel urine culture.   Urinalysis, Routine w reflex microscopic(!) [AM]  2129 Clue Cells  Wet Prep HPF POC(!): PRESENT [AM]  2129 Will treat for BV.   WBC, Wet Prep HPF POC(!): MANY [AM]  2143 Stable and improved.   Hemoglobin(!): 11.1 [AM]    Clinical Course User Index [AM] Elisha PonderMurray, Roark Rufo B, PA-C       Patient is nontoxic-appearing, afebrile, with a nonsurgical abdomen.  Differential diagnosis includes diverticulitis, appendicitis, PID, urinary tract infection, ovarian torsion, ectopic pregnancy, musculoskeletal strain.  Further questioning the patient, she reveals that the pain started after she had vigorous intercourse.  She is concerned about injury that occurred at this time.  Work-up revealing contaminated urine, but does not appear infected.  Mild leukocytosis of 11.8.  Abnormalities on CMP.  Pregnancy test is negative.  Wet prep with clue cells many WBCs, not clinically demonstrative of pelvic inflammatory disease.  Patient had a negative troponin and EKG normal sinus rhythm without evidence of acute ischemia, infarction, arrhythmia.  Regarding patient's paresthesias of the left deltoid and left anterior thigh, appears that the left deltoid paresthesias may be new.  She has intact sensation on exam.  Suspect possible meralgia paresthetica of the left anterior thigh.  Clinically, presentation not indicative of CVA or thoracic aortic dissection.  Patient be treated for bacterial vaginosis.  Will also have patient take NSAIDs and perform warm compresses for possible strain of groin.  She is given her precautions for increasing pain, intractable nausea or vomiting, new or worsening symptoms.  Patient is in understanding and agrees with the plan of care.  Final Clinical Impressions(s) / ED Diagnoses   Final diagnoses:  Left groin pain  Bacterial vaginosis  Microcytic  anemia    ED Discharge Orders         Ordered    metroNIDAZOLE (FLAGYL) 500 MG tablet  2 times daily     10/18/18 2144           Delia ChimesMurray, Teona Vargus B, PA-C 10/18/18 2147    Melene PlanFloyd, Dan, DO 10/18/18 2151

## 2018-10-20 LAB — HIV ANTIBODY (ROUTINE TESTING W REFLEX): HIV Screen 4th Generation wRfx: NONREACTIVE

## 2018-10-20 LAB — RPR: RPR Ser Ql: NONREACTIVE

## 2018-10-20 LAB — GC/CHLAMYDIA PROBE AMP (~~LOC~~) NOT AT ARMC
Chlamydia: NEGATIVE
Neisseria Gonorrhea: NEGATIVE

## 2018-12-01 ENCOUNTER — Other Ambulatory Visit: Payer: Self-pay

## 2018-12-01 ENCOUNTER — Encounter (HOSPITAL_BASED_OUTPATIENT_CLINIC_OR_DEPARTMENT_OTHER): Payer: Self-pay | Admitting: Emergency Medicine

## 2018-12-01 ENCOUNTER — Emergency Department (HOSPITAL_BASED_OUTPATIENT_CLINIC_OR_DEPARTMENT_OTHER)
Admission: EM | Admit: 2018-12-01 | Discharge: 2018-12-01 | Disposition: A | Discharge status: Home / Self Care | Payer: Medicaid Other | Attending: Emergency Medicine | Admitting: Emergency Medicine

## 2018-12-01 DIAGNOSIS — N390 Urinary tract infection, site not specified: Secondary | ICD-10-CM

## 2018-12-01 DIAGNOSIS — Z79899 Other long term (current) drug therapy: Secondary | ICD-10-CM | POA: Insufficient documentation

## 2018-12-01 DIAGNOSIS — Z7722 Contact with and (suspected) exposure to environmental tobacco smoke (acute) (chronic): Secondary | ICD-10-CM | POA: Insufficient documentation

## 2018-12-01 LAB — URINALYSIS, ROUTINE W REFLEX MICROSCOPIC
Bilirubin Urine: NEGATIVE
Glucose, UA: NEGATIVE mg/dL
Hgb urine dipstick: NEGATIVE
Ketones, ur: NEGATIVE mg/dL
Nitrite: NEGATIVE
Protein, ur: NEGATIVE mg/dL
Specific Gravity, Urine: >1.03 — ABNORMAL HIGH (ref 1.005–1.030)
pH: 5.5 (ref 5.0–8.0)

## 2018-12-01 LAB — URINALYSIS, MICROSCOPIC (REFLEX)

## 2018-12-01 LAB — PREGNANCY, URINE: Preg Test, Ur: NEGATIVE

## 2018-12-01 MED ORDER — CEPHALEXIN 250 MG PO CAPS
1000.0000 mg | ORAL_CAPSULE | Freq: Once | ORAL | Status: AC
  Administered 2018-12-01: 1000 mg via ORAL
  Filled 2018-12-01: qty 4

## 2018-12-01 MED ORDER — PHENAZOPYRIDINE HCL 100 MG PO TABS
200.0000 mg | ORAL_TABLET | Freq: Once | ORAL | Status: AC
  Administered 2018-12-01: 200 mg via ORAL
  Filled 2018-12-01: qty 2

## 2018-12-01 MED ORDER — PHENAZOPYRIDINE HCL 200 MG PO TABS: 200.0000 mg | ORAL_TABLET | Freq: Three times a day (TID) | ORAL | 0 refills | Status: AC | PRN

## 2018-12-01 MED ORDER — CEPHALEXIN 500 MG PO CAPS: 500.0000 mg | ORAL_CAPSULE | Freq: Two times a day (BID) | ORAL | 0 refills | Status: DC

## 2018-12-01 NOTE — ED Triage Notes (Signed)
Patient arrived via POV c/o burning sensation with urination, cloudy urine with malodorous smell. Patient states this started Sunday, approximately 4 days prior. Patient is AO x 4, normal gait, VS WDL with elevated BP.

## 2018-12-01 NOTE — ED Provider Notes (Signed)
[name] Provider Note: [name], MD, FACEP  CSN: [id number] MRN: [id number] ARRIVAL: 12/01/18 at 0355 ROOM: [place]  Dysuria   HISTORY OF PRESENT ILLNESS  12/01/18 4:11 AM Lisa Mcpherson is a 38 y.o. female with a 4-day history of a burning sensation with urination, cloudy urine and abnormal urine odor.  She rates the burning as a 7 out of 10.  She denies fever, chills, nausea, vomiting, diarrhea, vaginal bleeding or vaginal discharge.  She is having some left lower back pain.   Past Medical History:  Diagnosis Date  . Amenorrhea   . Anemia   . Arm numbness   . Chlamydia infection   . Cough   . Dysuria   . Fatigue   . Fatty liver   . Low TSH level   . Obesity   . Prediabetes   . Sinusitis   . SOB (shortness of breath)   . Trichomonosis   . UTI (urinary tract infection)   . Vaginal discharge     Past Surgical History:  Procedure Laterality Date  . CESAREAN SECTION      Family History  Problem Relation Age of Onset  . CAD Mother   . Stroke Neg Hx     Social History   Tobacco Use  . Smoking status: Passive Smoke Exposure - Never Smoker  . Smokeless tobacco: Never Used  Substance Use Topics  . Alcohol use: Yes    Comment: weekends  . Drug use: No    Prior to Admission medications   Medication Sig Start Date End Date Taking? Authorizing Provider  linaclotide (LINZESS) 290 MCG CAPS capsule Take 290 mcg by mouth daily as needed (constipation).     [provider]  lisinopril (PRINIVIL,ZESTRIL) 10 MG tablet Take 1 tablet (10 mg total) by mouth daily. 02/18/18 02/18/19  Radene Gunning, NP  metoprolol succinate (TOPROL-XL) 25 MG 24 hr tablet Take 1 tablet (25 mg total) by mouth daily. Patient not taking: Reported on 05/03/2018 02/19/18   Radene Gunning, NP  metroNIDAZOLE (FLAGYL) 500 MG tablet Take 1 tablet (500 mg total) by mouth 2 (two) times daily. 10/18/18   Langston Masker B, PA-C    Allergies Patient  has no known allergies.   REVIEW OF SYSTEMS  Negative except as noted here or in the History of Present Illness.   PHYSICAL EXAMINATION  Initial Vital Signs Blood pressure 137/64, pulse 98, temperature 98.2 F (36.8 C), temperature source Oral, resp. rate 16, height 4\' 11"  (1.499 m), weight 117.9 kg, last menstrual period 10/28/2018, SpO2 100 %.  Examination General: Well-developed, well-nourished female in no acute distress; appearance consistent with age of record HENT: normocephalic; atraumatic Eyes: Normal appearance Neck: supple Heart: regular rate and rhythm Lungs: clear to auscultation bilaterally Abdomen: soft; nondistended; nontender; bowel sounds present Extremities: No deformity; full range of motion; pulses normal Neurologic: Awake, alert and oriented; motor function intact in all extremities and symmetric; no facial droop Skin: Warm and dry Psychiatric: Normal mood and affect   RESULTS  Summary of this visit's results, reviewed by myself:   EKG Interpretation  Date/Time:    Ventricular Rate:    PR Interval:    QRS Duration:   QT Interval:    QTC Calculation:   R Axis:     Text Interpretation:        Laboratory Studies: Results for orders placed or performed during the hospital encounter of 12/01/18 (from the past 24 hour(s))  Urinalysis, Routine w reflex microscopic     Status: Abnormal   Collection Time: 12/01/18  4:20 AM  Result Value Ref Range   Color, Urine YELLOW YELLOW   APPearance CLOUDY (A) CLEAR   Specific Gravity, Urine >1.030 (H) 1.005 - 1.030   pH 5.5 5.0 - 8.0   Glucose, UA NEGATIVE NEGATIVE mg/dL   Hgb urine dipstick NEGATIVE NEGATIVE   Bilirubin Urine NEGATIVE NEGATIVE   Ketones, ur NEGATIVE NEGATIVE mg/dL   Protein, ur NEGATIVE NEGATIVE mg/dL   Nitrite NEGATIVE NEGATIVE   Leukocytes,Ua SMALL (A) NEGATIVE  Pregnancy, urine     Status: None   Collection Time: 12/01/18  4:20 AM  Result Value Ref Range   Preg Test, Ur NEGATIVE  NEGATIVE  Urinalysis, Microscopic (reflex)     Status: Abnormal   Collection Time: 12/01/18  4:20 AM  Result Value Ref Range   RBC / HPF 0-5 0 - 5 RBC/hpf   WBC, UA 11-20 0 - 5 WBC/hpf   Bacteria, UA MANY (A) NONE SEEN   Squamous Epithelial / LPF 21-50 0 - 5   Mucus PRESENT    Imaging Studies: No results found.  ED COURSE and MDM  Nursing notes and initial vitals signs, including pulse oximetry, reviewed.  Vitals:   12/01/18 0407 12/01/18 0410  BP: 137/64   Pulse: 98   Resp: 16   Temp: 98.2 F (36.8 C)   TempSrc: Oral   SpO2: 100%   Weight:  117.9 kg  Height:  4\' 11"  (1.499 m)   Will treat for acute cystitis.  Urine sent for culture as urinalysis is somewhat equivocal but her presentation is consistent with a urinary tract infection.  PROCEDURES    ED DIAGNOSES     ICD-10-CM   1. Lower urinary tract infectious disease  N39.0        Shaquita Fort, MD 12/01/18 [id number]

## 2018-12-03 LAB — URINE CULTURE: Culture: 80000 — AB

## 2018-12-04 ENCOUNTER — Telehealth: Payer: Self-pay | Admitting: *Deleted

## 2018-12-04 NOTE — Telephone Encounter (Signed)
Post ED Visit - Positive Culture Follow-up  Culture report reviewed by antimicrobial stewardship pharmacist: [place] Team []  Elenor Quinones, Pharm.D. []  Heide Guile, Pharm.D., BCPS AQ-ID []  Parks Neptune, Pharm.D., BCPS []  Alycia Rossetti, Pharm.D., BCPS []  [place], Pharm.D., BCPS, AAHIVP []  Legrand Como, Pharm.D., BCPS, AAHIVP [x]  Salome Arnt, PharmD, BCPS []  Johnnette Gourd, PharmD, BCPS []  Hughes Better, PharmD, BCPS []  Leeroy Cha, PharmD []  Laqueta Linden, PharmD, BCPS []  Albertina Parr, PharmD  [place] Team []  Leodis Sias, PharmD []  Lindell Spar, PharmD []  Royetta Asal, PharmD []  Graylin Shiver, Rph []  Rema Fendt) Glennon Mac, PharmD []  Arlyn Dunning, PharmD []  Netta Cedars, PharmD []  Dia Sitter, PharmD []  Leone Haven, PharmD []  Gretta Arab, PharmD []  Theodis Shove, PharmD []  Peggyann Juba, PharmD []  Reuel Boom, PharmD   Positive urine culture Treated with Cephalexin, organism sensitive to the same and no further patient follow-up is required at this time.  Harlon Flor [facility] 12/04/2018, 2:55 PM

## 2019-04-09 ENCOUNTER — Other Ambulatory Visit: Payer: Self-pay

## 2019-04-09 ENCOUNTER — Encounter (HOSPITAL_BASED_OUTPATIENT_CLINIC_OR_DEPARTMENT_OTHER): Payer: Self-pay | Admitting: Emergency Medicine

## 2019-04-09 ENCOUNTER — Emergency Department (HOSPITAL_BASED_OUTPATIENT_CLINIC_OR_DEPARTMENT_OTHER)
Admission: EM | Admit: 2019-04-09 | Discharge: 2019-04-09 | Disposition: A | Discharge status: Home / Self Care | Payer: Medicaid Other | Attending: Emergency Medicine | Admitting: Emergency Medicine

## 2019-04-09 DIAGNOSIS — R1031 Right lower quadrant pain: Secondary | ICD-10-CM | POA: Insufficient documentation

## 2019-04-09 DIAGNOSIS — N3001 Acute cystitis with hematuria: Secondary | ICD-10-CM

## 2019-04-09 DIAGNOSIS — N939 Abnormal uterine and vaginal bleeding, unspecified: Secondary | ICD-10-CM

## 2019-04-09 DIAGNOSIS — D649 Anemia, unspecified: Secondary | ICD-10-CM

## 2019-04-09 DIAGNOSIS — R7303 Prediabetes: Secondary | ICD-10-CM | POA: Insufficient documentation

## 2019-04-09 DIAGNOSIS — Z79899 Other long term (current) drug therapy: Secondary | ICD-10-CM | POA: Insufficient documentation

## 2019-04-09 LAB — CBC WITH DIFFERENTIAL/PLATELET
Abs Immature Granulocytes: 0.02 10*3/uL (ref 0.00–0.07)
Basophils Absolute: 0 10*3/uL (ref 0.0–0.1)
Basophils Relative: 1 %
Eosinophils Absolute: 0.1 10*3/uL (ref 0.0–0.5)
Eosinophils Relative: 1 %
HCT: 34.1 % — ABNORMAL LOW (ref 36.0–46.0)
Hemoglobin: 10.2 g/dL — ABNORMAL LOW (ref 12.0–15.0)
Immature Granulocytes: 0 %
Lymphocytes Relative: 33 %
Lymphs Abs: 2.5 10*3/uL (ref 0.7–4.0)
MCH: 23 pg — ABNORMAL LOW (ref 26.0–34.0)
MCHC: 29.9 g/dL — ABNORMAL LOW (ref 30.0–36.0)
MCV: 77 fL — ABNORMAL LOW (ref 80.0–100.0)
Monocytes Absolute: 0.7 10*3/uL (ref 0.1–1.0)
Monocytes Relative: 9 %
Neutro Abs: 4.3 10*3/uL (ref 1.7–7.7)
Neutrophils Relative %: 56 %
Platelets: 393 10*3/uL (ref 150–400)
RBC: 4.43 MIL/uL (ref 3.87–5.11)
RDW: 19.2 % — ABNORMAL HIGH (ref 11.5–15.5)
WBC: 7.7 10*3/uL (ref 4.0–10.5)
nRBC: 0 % (ref 0.0–0.2)

## 2019-04-09 LAB — URINALYSIS, ROUTINE W REFLEX MICROSCOPIC

## 2019-04-09 LAB — PREGNANCY, URINE: Preg Test, Ur: NEGATIVE

## 2019-04-09 LAB — URINALYSIS, MICROSCOPIC (REFLEX)
RBC / HPF: >50 RBC/hpf (ref 0–5)
WBC, UA: >50 WBC/hpf (ref 0–5)

## 2019-04-09 MED ORDER — CEPHALEXIN 500 MG PO CAPS: 500.0000 mg | ORAL_CAPSULE | Freq: Two times a day (BID) | ORAL | 0 refills | Status: AC

## 2019-04-09 MED ORDER — FERROUS SULFATE 325 (65 FE) MG PO TABS: 325.0000 mg | ORAL_TABLET | Freq: Every day | ORAL | 0 refills | Status: DC

## 2019-04-09 MED ORDER — IBUPROFEN 600 MG PO TABS: 600.0000 mg | ORAL_TABLET | Freq: Four times a day (QID) | ORAL | 0 refills | Status: AC | PRN

## 2019-04-09 NOTE — Discharge Instructions (Addendum)
You were given a prescription for antibiotics. Please take the antibiotic prescription fully.   A culture was sent of your urine today to determine if there is any bacterial growth. If the results of the culture are positive and you require an antibiotic or a change of your prescribed antibiotic you will be contacted by the hospital. If the results are negative you will not be contacted.  You were started on iron for your anemia. You were started on ibuprofen as well.   Please make an appointment for follow up with OB-GYN in the next 5-7 days. Please return to the ED for new or worsening symptoms including persistent vaginal bleeding, lightheadedness, passing out, vomiting, fevers, abdominal pain.

## 2019-04-09 NOTE — ED Provider Notes (Signed)
[facility] EMERGENCY DEPARTMENT Provider Note   CSN: [id number] Arrival date & time: 04/09/19  1154     History   Chief Complaint Chief Complaint  Patient presents with  . Vaginal Bleeding    HPI Lisa Mcpherson is a 38 y.o. female.     HPI   Pt is a 38 y/o female with a h/o anemia, STIs, fatty liver, obesity, prediabetes, sinusitis, UTI, who presents to the ED today for eval of vaginal bleeding that has been present for 2 weeks. Bleeding has become heavier since it started and she has been passing clots. In the last 48 hours she has had to change her super plus tampons every 2 hours. Reports increased fatigue. Denies chest pain or sob.   States cycles are normally "medium-heavy". Menses usually last for 3-5 days. She is usually very regular but states that she has not had a cycle since August.   She reports the she has also had right sided flank pain the has been intermittent and seems to happen when she urinates and when she moves in certain positions. She is not sure if this is related to a muscle spasm. Denies nausea, vomiting, dysuria, frequency. Declines std testing as she states she was just tested.   [facility]P: [facility].   Past Medical History:  Diagnosis Date  . Amenorrhea   . Anemia   . Arm numbness   . Chlamydia infection   . Cough   . Dysuria   . Fatigue   . Fatty liver   . Low TSH level   . Obesity   . Prediabetes   . Sinusitis   . SOB (shortness of breath)   . Trichomonosis   . UTI (urinary tract infection)   . Vaginal discharge     Patient Active Problem List   Diagnosis Date Noted  . Chest discomfort 05/03/2018  . Morbid obesity (HCC) 05/03/2018  . Anemia 02/18/2018  . Low TSH level   . Tachycardia 02/17/2018  . Chest pain 02/17/2018  . Essential hypertension 02/17/2018    Past Surgical History:  Procedure Laterality Date  . CESAREAN SECTION       OB History   No obstetric history on file.      Home  Medications    Prior to Admission medications   Medication Sig Start Date End Date Taking? Authorizing Provider  cephALEXin (KEFLEX) 500 MG capsule Take 1 capsule (500 mg total) by mouth 2 (two) times daily for 7 days. 04/09/19 04/16/19  Naika Noto S, PA-C  ferrous sulfate 325 (65 FE) MG tablet Take 1 tablet (325 mg total) by mouth daily. 04/09/19   Shatoria Stooksbury S, PA-C  ibuprofen (ADVIL) 600 MG tablet Take 1 tablet (600 mg total) by mouth every 6 (six) hours as needed. 04/09/19   Abubakr Wieman S, PA-C  linaclotide (LINZESS) 290 MCG CAPS capsule Take 290 mcg by mouth daily as needed (constipation).     [provider]  lisinopril (PRINIVIL,ZESTRIL) 10 MG tablet Take 1 tablet (10 mg total) by mouth daily. 02/18/18 02/18/19  Gwenyth Bender, NP  metoprolol succinate (TOPROL-XL) 25 MG 24 hr tablet Take 1 tablet (25 mg total) by mouth daily. Patient not taking: Reported on 05/03/2018 02/19/18   Gwenyth Bender, NP  phenazopyridine (PYRIDIUM) 200 MG tablet Take 1 tablet (200 mg total) by mouth 3 (three) times daily as needed for pain. 12/01/18   Molpus, Jonny Ruiz, MD    Family History Family History  Problem Relation  Age of Onset  . CAD Mother   . Stroke Neg Hx     Social History Social History   Tobacco Use  . Smoking status: Passive Smoke Exposure - Never Smoker  . Smokeless tobacco: Never Used  Substance Use Topics  . Alcohol use: Yes    Comment: weekends  . Drug use: No     Allergies   Patient has no known allergies.   Review of Systems Review of Systems  Constitutional: Positive for fatigue. Negative for fever.  HENT: Negative for ear pain and sore throat.   Eyes: Negative for visual disturbance.  Respiratory: Negative for cough and shortness of breath.   Cardiovascular: Negative for chest pain.  Gastrointestinal: Negative for abdominal pain, constipation, diarrhea, nausea and vomiting.  Genitourinary: Positive for vaginal bleeding. Negative for dysuria,  hematuria and vaginal discharge.  Musculoskeletal: Negative for back pain.  Skin: Negative for rash.  Neurological: Negative for dizziness, syncope, weakness and light-headedness.  All other systems reviewed and are negative.    Physical Exam Updated Vital Signs BP 127/83 (BP Location: Right Arm)   Pulse 88   Temp 98.4 F (36.9 C)   Resp 18   Ht 4\' 11"  (1.499 m)   Wt 117.9 kg   SpO2 100%   BMI 52.51 kg/m   Physical Exam Vitals signs and nursing note reviewed.  Constitutional:      General: She is not in acute distress.    Appearance: She is well-developed.  HENT:     Head: Normocephalic and atraumatic.  Eyes:     Conjunctiva/sclera: Conjunctivae normal.  Neck:     Musculoskeletal: Neck supple.  Cardiovascular:     Rate and Rhythm: Normal rate and regular rhythm.     Heart sounds: Normal heart sounds. No murmur.  Pulmonary:     Effort: Pulmonary effort is normal. No respiratory distress.     Breath sounds: Normal breath sounds. No wheezing, rhonchi or rales.  Abdominal:     General: Bowel sounds are normal.     Palpations: Abdomen is soft.     Tenderness: There is no abdominal tenderness. There is no right CVA tenderness, left CVA tenderness, guarding or rebound.  Genitourinary:    Comments: Exam performed by Karrie Meresortni S Maanav Kassabian,  exam chaperoned Date: 04/09/2019 Pelvic exam: normal external genitalia without evidence of trauma. VULVA: normal appearing vulva with no masses, tenderness or lesion. VAGINA: normal appearing vagina with normal color and discharge, no lesions. Mild discharge present.  CERVIX: cervix not visualized, cervical motion tenderness absent, cervical os closed with out purulent discharge; vaginal discharge, Wet prep and DNA probe for chlamydia and GC obtained.   ADNEXA: normal adnexa in size, nontender and no masses UTERUS: uterus is normal size, shape, consistency and nontender.  Skin:    General: Skin is warm and dry.  Neurological:     Mental  Status: She is alert.     ED Treatments / Results  Labs (all labs ordered are listed, but only abnormal results are displayed) Labs Reviewed  URINALYSIS, ROUTINE W REFLEX MICROSCOPIC - Abnormal; Notable for the following components:      Result Value   Color, Urine RED (*)    APPearance TURBID (*)    Glucose, UA   (*)    Value: TEST NOT REPORTED DUE TO COLOR INTERFERENCE OF URINE PIGMENT   Hgb urine dipstick   (*)    Value: TEST NOT REPORTED DUE TO COLOR INTERFERENCE OF URINE PIGMENT   Bilirubin Urine   (*)  Value: TEST NOT REPORTED DUE TO COLOR INTERFERENCE OF URINE PIGMENT   Ketones, ur   (*)    Value: TEST NOT REPORTED DUE TO COLOR INTERFERENCE OF URINE PIGMENT   Protein, ur   (*)    Value: TEST NOT REPORTED DUE TO COLOR INTERFERENCE OF URINE PIGMENT   Nitrite   (*)    Value: TEST NOT REPORTED DUE TO COLOR INTERFERENCE OF URINE PIGMENT   Leukocytes,Ua   (*)    Value: TEST NOT REPORTED DUE TO COLOR INTERFERENCE OF URINE PIGMENT   All other components within normal limits  CBC WITH DIFFERENTIAL/PLATELET - Abnormal; Notable for the following components:   Hemoglobin 10.2 (*)    HCT 34.1 (*)    MCV 77.0 (*)    MCH 23.0 (*)    MCHC 29.9 (*)    RDW 19.2 (*)    All other components within normal limits  URINALYSIS, MICROSCOPIC (REFLEX) - Abnormal; Notable for the following components:   Bacteria, UA MANY (*)    All other components within normal limits  PREGNANCY, URINE    EKG None  Radiology No results found.  Procedures Procedures (including critical care time)  Medications Ordered in ED Medications - No data to display   Initial Impression / Assessment and Plan / ED Course  I have reviewed the triage vital signs and the nursing notes.  Pertinent labs & imaging results that were available during my care of the patient were reviewed by me and considered in my medical decision making (see chart for details).   Final Clinical Impressions(s) / ED Diagnoses    Final diagnoses:  Abnormal uterine bleeding (AUB)  Anemia, unspecified type  Acute cystitis with hematuria   38 year old female presenting for evaluation of vaginal bleeding that has been present for 2 weeks.  For the last 48 hours bleeding has worsened and become more prominent.  Patient does have some scant bleeding on exam but no obvious significant bleeding on pelvic exam.  No pain on pelvic exam.  Vital signs are within normal limits.  No hypotension.  CBC with anemia, has history of same.  Very slightly worse than prior.  Urine pregnancy test negative.  Urinalysis unable to be fully interpreted due to discoloration from vaginal bleeding however on microscopic UA patient has greater than 50 WBCs, greater than 50 RBCs, many bacteria.  We will treat her with Keflex, She does have history of UTI.,  She appears stable here.  I will start her on iron and also ibuprofen.  Have advised that she follow-up with OB/GYN as she may need outpatient ultrasound for further work-up of her abnormal uterine bleeding.  Advised on specific return precautions.  She voices understanding of the plan and reasons to return.  All questions answered.  Patient stable for discharge.  ED Discharge Orders         Ordered    cephALEXin (KEFLEX) 500 MG capsule  2 times daily     04/09/19 1508    ferrous sulfate 325 (65 FE) MG tablet  Daily     04/09/19 1508    ibuprofen (ADVIL) 600 MG tablet  Every 6 hours PRN     04/09/19 1514           Darlys Buis S, PA-C 04/09/19 1714    Hayden Rasmussen, MD 04/09/19 1730

## 2019-04-09 NOTE — ED Notes (Signed)
Pt verbalized understanding of dc instructions.

## 2019-04-09 NOTE — ED Triage Notes (Signed)
Patient states that she is having frequent blood clots, and heavy vaginal bleeding  X 2 weeks

## 2019-06-03 ENCOUNTER — Emergency Department (HOSPITAL_BASED_OUTPATIENT_CLINIC_OR_DEPARTMENT_OTHER)
Admission: EM | Admit: 2019-06-03 | Discharge: 2019-06-03 | Disposition: A | Discharge status: Home / Self Care | Payer: Medicaid Other | Attending: Emergency Medicine | Admitting: Emergency Medicine

## 2019-06-03 ENCOUNTER — Other Ambulatory Visit: Payer: Self-pay

## 2019-06-03 ENCOUNTER — Ambulatory Visit (HOSPITAL_BASED_OUTPATIENT_CLINIC_OR_DEPARTMENT_OTHER)
Admission: RE | Admit: 2019-06-03 | Discharge: 2019-06-03 | Disposition: A | Discharge status: Home / Self Care | Payer: Medicaid Other | Source: Ambulatory Visit | Attending: Emergency Medicine | Admitting: Emergency Medicine

## 2019-06-03 DIAGNOSIS — Z79899 Other long term (current) drug therapy: Secondary | ICD-10-CM | POA: Diagnosis not present

## 2019-06-03 DIAGNOSIS — I10 Essential (primary) hypertension: Secondary | ICD-10-CM | POA: Insufficient documentation

## 2019-06-03 DIAGNOSIS — M549 Dorsalgia, unspecified: Secondary | ICD-10-CM | POA: Diagnosis not present

## 2019-06-03 DIAGNOSIS — N939 Abnormal uterine and vaginal bleeding, unspecified: Secondary | ICD-10-CM | POA: Insufficient documentation

## 2019-06-03 DIAGNOSIS — Z7722 Contact with and (suspected) exposure to environmental tobacco smoke (acute) (chronic): Secondary | ICD-10-CM | POA: Insufficient documentation

## 2019-06-03 DIAGNOSIS — E669 Obesity, unspecified: Secondary | ICD-10-CM | POA: Diagnosis not present

## 2019-06-03 DIAGNOSIS — D500 Iron deficiency anemia secondary to blood loss (chronic): Secondary | ICD-10-CM | POA: Insufficient documentation

## 2019-06-03 DIAGNOSIS — N938 Other specified abnormal uterine and vaginal bleeding: Secondary | ICD-10-CM

## 2019-06-03 DIAGNOSIS — R1030 Lower abdominal pain, unspecified: Secondary | ICD-10-CM | POA: Diagnosis not present

## 2019-06-03 LAB — CBC WITH DIFFERENTIAL/PLATELET
Abs Immature Granulocytes: 0.05 10*3/uL (ref 0.00–0.07)
Basophils Absolute: 0 10*3/uL (ref 0.0–0.1)
Basophils Relative: 0 %
Eosinophils Absolute: 0.1 10*3/uL (ref 0.0–0.5)
Eosinophils Relative: 1 %
HCT: 22.7 % — ABNORMAL LOW (ref 36.0–46.0)
Hemoglobin: 6.4 g/dL — CL (ref 12.0–15.0)
Immature Granulocytes: 1 %
Lymphocytes Relative: 29 %
Lymphs Abs: 2.9 10*3/uL (ref 0.7–4.0)
MCH: 20.2 pg — ABNORMAL LOW (ref 26.0–34.0)
MCHC: 28.2 g/dL — ABNORMAL LOW (ref 30.0–36.0)
MCV: 71.6 fL — ABNORMAL LOW (ref 80.0–100.0)
Monocytes Absolute: 0.6 10*3/uL (ref 0.1–1.0)
Monocytes Relative: 6 %
Neutro Abs: 6.5 10*3/uL (ref 1.7–7.7)
Neutrophils Relative %: 63 %
Platelets: 481 10*3/uL — ABNORMAL HIGH (ref 150–400)
RBC: 3.17 MIL/uL — ABNORMAL LOW (ref 3.87–5.11)
RDW: 18.3 % — ABNORMAL HIGH (ref 11.5–15.5)
WBC: 10.3 10*3/uL (ref 4.0–10.5)
nRBC: 0.6 % — ABNORMAL HIGH (ref 0.0–0.2)

## 2019-06-03 LAB — URINALYSIS, ROUTINE W REFLEX MICROSCOPIC
Bilirubin Urine: NEGATIVE
Glucose, UA: NEGATIVE mg/dL
Ketones, ur: NEGATIVE mg/dL
Nitrite: NEGATIVE
Protein, ur: NEGATIVE mg/dL
Specific Gravity, Urine: 1.02 (ref 1.005–1.030)
pH: 6 (ref 5.0–8.0)

## 2019-06-03 LAB — URINALYSIS, MICROSCOPIC (REFLEX)

## 2019-06-03 LAB — BASIC METABOLIC PANEL
Anion gap: 6 (ref 5–15)
BUN: 12 mg/dL (ref 6–20)
CO2: 23 mmol/L (ref 22–32)
Calcium: 8.7 mg/dL — ABNORMAL LOW (ref 8.9–10.3)
Chloride: 107 mmol/L (ref 98–111)
Creatinine, Ser: 0.93 mg/dL (ref 0.44–1.00)
GFR calc Af Amer: >60 mL/min (ref >60)
GFR calc non Af Amer: >60 mL/min (ref >60)
Glucose, Bld: 119 mg/dL — ABNORMAL HIGH (ref 70–99)
Potassium: 3.8 mmol/L (ref 3.5–5.1)
Sodium: 136 mmol/L (ref 135–145)

## 2019-06-03 LAB — HCG, SERUM, QUALITATIVE: Preg, Serum: NEGATIVE

## 2019-06-03 MED ORDER — FERROUS SULFATE 325 (65 FE) MG PO TABS: 325.0000 mg | ORAL_TABLET | Freq: Two times a day (BID) | ORAL | 0 refills | Status: AC

## 2019-06-03 MED ORDER — MEGESTROL ACETATE 40 MG PO TABS: ORAL_TABLET | ORAL | 0 refills | Status: AC

## 2019-06-03 NOTE — ED Triage Notes (Signed)
Pt reports ongoing vaginal bleeding since November. States she has not followed up w/ OBGYN.  Pt reports developing abdominal pain last night.  States that she is now passing large blood clots.

## 2019-06-03 NOTE — ED Provider Notes (Addendum)
[facility] EMERGENCY DEPARTMENT Provider Note   CSN: [id number] Arrival date & time: 06/03/19  0157     History Chief Complaint  Patient presents with  . Abdominal Pain    Lisa Mcpherson is a 39 y.o. female.  Patient is a 39 year old female with history of obesity, anemia.  She presents today for evaluation of vaginal bleeding and abdominal discomfort.  She reports to me that she has "been on her period" for the past 2 months.  She reports discomfort in her back and lower abdomen that began earlier today.  She describes a cramping pain and reports passing a large blood clot.  She denies any fevers or chills.  She denies any bowel or bladder complaints.  She is sexually active, but doubts the possibility of pregnancy.  The history is provided by the patient.  Abdominal Pain Pain location:  Suprapubic Pain quality: cramping   Pain radiates to:  Does not radiate Pain severity:  Moderate Duration:  6 hours Timing:  Intermittent Chronicity:  New Relieved by:  Nothing Worsened by:  Nothing Ineffective treatments:  None tried      Past Medical History:  Diagnosis Date  . Amenorrhea   . Anemia   . Arm numbness   . Chlamydia infection   . Cough   . Dysuria   . Fatigue   . Fatty liver   . Low TSH level   . Obesity   . Prediabetes   . Sinusitis   . SOB (shortness of breath)   . Trichomonosis   . UTI (urinary tract infection)   . Vaginal discharge     Patient Active Problem List   Diagnosis Date Noted  . Chest discomfort 05/03/2018  . Morbid obesity (HCC) 05/03/2018  . Anemia 02/18/2018  . Low TSH level   . Tachycardia 02/17/2018  . Chest pain 02/17/2018  . Essential hypertension 02/17/2018    Past Surgical History:  Procedure Laterality Date  . CESAREAN SECTION       OB History   No obstetric history on file.     Family History  Problem Relation Age of Onset  . CAD Mother   . Stroke Neg Hx     Social History   Tobacco Use  . Smoking  status: Passive Smoke Exposure - Never Smoker  . Smokeless tobacco: Never Used  Substance Use Topics  . Alcohol use: Yes    Comment: weekends  . Drug use: No    Home Medications Prior to Admission medications   Medication Sig Start Date End Date Taking? Authorizing Provider  ferrous sulfate 325 (65 FE) MG tablet Take 1 tablet (325 mg total) by mouth daily. 04/09/19   Couture, Cortni S, PA-C  ibuprofen (ADVIL) 600 MG tablet Take 1 tablet (600 mg total) by mouth every 6 (six) hours as needed. 04/09/19   Couture, Cortni S, PA-C  linaclotide (LINZESS) 290 MCG CAPS capsule Take 290 mcg by mouth daily as needed (constipation).     [provider]  lisinopril (PRINIVIL,ZESTRIL) 10 MG tablet Take 1 tablet (10 mg total) by mouth daily. 02/18/18 02/18/19  Gwenyth Bender, NP  metoprolol succinate (TOPROL-XL) 25 MG 24 hr tablet Take 1 tablet (25 mg total) by mouth daily. Patient not taking: Reported on 05/03/2018 02/19/18   Gwenyth Bender, NP  phenazopyridine (PYRIDIUM) 200 MG tablet Take 1 tablet (200 mg total) by mouth 3 (three) times daily as needed for pain. 12/01/18   Molpus, Jonny Ruiz, MD    Allergies  Patient has no known allergies.  Review of Systems   Review of Systems  Gastrointestinal: Positive for abdominal pain.  All other systems reviewed and are negative.   Physical Exam Updated Vital Signs BP 136/83 (BP Location: Left Arm)   Pulse (!) 115   Temp 98.9 F (37.2 C) (Oral)   Resp 18   Ht 4\' 11"  (1.499 m)   Wt 116.1 kg   LMP  (LMP Unknown) Comment: Has been cycle since November 2020  SpO2 100%   BMI 51.71 kg/m   Physical Exam Vitals and nursing note reviewed.  Constitutional:      General: She is not in acute distress.    Appearance: She is well-developed. She is not diaphoretic.  HENT:     Head: Normocephalic and atraumatic.  Cardiovascular:     Rate and Rhythm: Normal rate and regular rhythm.     Heart sounds: No murmur. No friction rub. No gallop.     Pulmonary:     Effort: Pulmonary effort is normal. No respiratory distress.     Breath sounds: Normal breath sounds. No wheezing.  Abdominal:     General: Bowel sounds are normal. There is no distension.     Palpations: Abdomen is soft.     Tenderness: There is abdominal tenderness in the suprapubic area. There is no right CVA tenderness, left CVA tenderness, guarding or rebound.  Musculoskeletal:        General: Normal range of motion.     Cervical back: Normal range of motion and neck supple.  Skin:    General: Skin is warm and dry.  Neurological:     Mental Status: She is alert and oriented to person, place, and time.     ED Results / Procedures / Treatments   Labs (all labs ordered are listed, but only abnormal results are displayed) Labs Reviewed  BASIC METABOLIC PANEL  CBC WITH DIFFERENTIAL/PLATELET  URINALYSIS, ROUTINE W REFLEX MICROSCOPIC  HCG, SERUM, QUALITATIVE    EKG None  Radiology No results found.  Procedures Procedures (including critical care time)  Medications Ordered in ED Medications - No data to display  ED Course  I have reviewed the triage vital signs and the nursing notes.  Pertinent labs & imaging results that were available during my care of the patient were reviewed by me and considered in my medical decision making (see chart for details).    MDM Rules/Calculators/A&P  Patient presenting here with ongoing vaginal bleeding and pelvic discomfort since the end of November.  She reports passing a clot this evening.  Patient's pregnancy test is negative.  Laboratory studies today show a hemoglobin of 6.4 with MCV of 71.  This finding was discussed with Dr. Elonda Husky from GYN.  He feels as though this anemia is chronic and patient can be treated with Megace and outpatient follow-up.  Dr. Elonda Husky is recommending Megace and iron supplementation which has been prescribed.  Patient is not symptomatic and has been ambulating through the department without  difficulty.  She is not orthostatic.  We discussed the possibility of a blood transfusion, however the patient would like to avoid this if at all possible.  If her bleeding worsens or she becomes symptomatic, she is to go to [facility].  She will be set up for an outpatient ultrasound and follow-up with GYN.  Final Clinical Impression(s) / ED Diagnoses Final diagnoses:  None    Rx / DC Orders ED Discharge Orders    None  Geoffery Lyons, MD 06/03/19 0530    Geoffery Lyons, MD 06/03/19 [phone]

## 2019-06-03 NOTE — Discharge Instructions (Signed)
Begin taking Megace and iron supplement as prescribed.  If you develop worsening bleeding, worsening pain, dizziness, shortness of breath, or other issues, you should go to [facility] for further evaluation.  Follow-up with GYN.  The contact information for [facility] health care has been provided in this discharge summary for you to call and make these arrangements.

## 2019-06-03 NOTE — ED Notes (Addendum)
Date and time results received: 06/03/19 0451  Test: Hgb Critical Value: 6.4   Name of Provider Notified: Dr. Judd Lien  Orders Received? Or Actions Taken?: No new orders at this time.

## 2019-07-26 ENCOUNTER — Ambulatory Visit (INDEPENDENT_AMBULATORY_CARE_PROVIDER_SITE_OTHER): Payer: Medicaid Other | Admitting: Cardiology

## 2019-07-26 ENCOUNTER — Encounter: Payer: Self-pay | Admitting: Cardiology

## 2019-07-26 ENCOUNTER — Other Ambulatory Visit: Payer: Self-pay

## 2019-07-26 VITALS — BP 100/68 | HR 100 | Ht 59.0 in | Wt 266.0 lb

## 2019-07-26 DIAGNOSIS — I10 Essential (primary) hypertension: Secondary | ICD-10-CM

## 2019-07-26 DIAGNOSIS — R011 Cardiac murmur, unspecified: Secondary | ICD-10-CM

## 2019-07-26 HISTORY — DX: Cardiac murmur, unspecified: R01.1

## 2019-07-26 NOTE — Progress Notes (Signed)
Cardiology Office Note:    Date:  07/26/2019   ID:  Lisa Mcpherson, DOB February 15, 1981, MRN [id number]  PCP:  [place]  Cardiologist:  Jenean Lindau, MD   Referring MD: [place]:    1. Essential hypertension   2. Cardiac murmur    PLAN:    In order of problems listed above:  1. Primary prevention stressed with the patient.  Importance of compliance with diet and medication stressed and she vocalized understanding. 2. Essential hypertension: Blood pressure is stable. 3. Echocardiogram will be done to assess murmur heard on auscultation. 4. Morbid obesity: Diet was discussed.  Importance of weight reduction was stressed and the risks of obesity explained and she promises to do better. 5. Patient will be seen in follow-up appointment in 6 months or earlier if the patient has any concerns    Medication Adjustments/Labs and Tests Ordered: Current medicines are reviewed at length with the patient today.  Concerns regarding medicines are outlined above.  No orders of the defined types were placed in this encounter.  No orders of the defined types were placed in this encounter.    Chief Complaint  Patient presents with  . Follow-up    1 Year     History of Present Illness:    Lisa Mcpherson is a 39 y.o. female.  Patient has past medical history of essential hypertension.  She was recently admitted and treated for severe anemia.  Subsequently she has done well.  She tells me that she has a follow-up appointment tomorrow with her primary care physician for complete blood work.  She denies any chest pain orthopnea or PND.  She mentions to me that she has significant acid reflux and has taken medicine for it from a friend with full relief.  I told her to talk to primary care physician tomorrow about it.  At the time of my evaluation, the patient is alert awake oriented and in no distress.  Past Medical History:  Diagnosis Date  .  Amenorrhea   . Anemia   . Arm numbness   . Chlamydia infection   . Cough   . Dysuria   . Fatigue   . Fatty liver   . Low TSH level   . Obesity   . Prediabetes   . Sinusitis   . SOB (shortness of breath)   . Trichomonosis   . UTI (urinary tract infection)   . Vaginal discharge     Past Surgical History:  Procedure Laterality Date  . CESAREAN SECTION      Current Medications: Current Meds  Medication Sig  . ferrous sulfate 325 (65 FE) MG tablet Take 1 tablet (325 mg total) by mouth 2 (two) times daily with a meal.  . lisinopril (PRINIVIL,ZESTRIL) 10 MG tablet Take 1 tablet (10 mg total) by mouth daily.     Allergies:   Patient has no known allergies.   Social History   Socioeconomic History  . Marital status: Single    Spouse name: Not on file  . Number of children: Not on file  . Years of education: Not on file  . Highest education level: Not on file  Occupational History  . Not on file  Tobacco Use  . Smoking status: Passive Smoke Exposure - Never Smoker  . Smokeless tobacco: Never Used  Substance and Sexual Activity  . Alcohol use: Yes    Comment: weekends  . Drug use: No  . Sexual activity: Not  on file  Other Topics Concern  . Not on file  Social History Narrative  . Not on file   Social Determinants of Health   Financial Resource Strain:   . Difficulty of Paying Living Expenses:   Food Insecurity:   . Worried About Programme researcher, broadcasting/film/video in the Last Year:   . Barista in the Last Year:   Transportation Needs:   . Freight forwarder (Medical):   Marland Kitchen Lack of Transportation (Non-Medical):   Physical Activity:   . Days of Exercise per Week:   . Minutes of Exercise per Session:   Stress:   . Feeling of Stress :   Social Connections:   . Frequency of Communication with Friends and Family:   . Frequency of Social Gatherings with Friends and Family:   . Attends Religious Services:   . Active Member of Clubs or Organizations:   . Attends Occupational hygienist Meetings:   Marland Kitchen Marital Status:      Family History: The patient's family history includes CAD in her mother. There is no history of Stroke.  ROS:   Please see the history of present illness.    All other systems reviewed and are negative.  EKGs/Labs/Other Studies Reviewed:    The following studies were reviewed today: Echocardiogram of appointment will be given to the patient.  Blood work report and emergency room records were reviewed.   Recent Labs: 10/18/2018: ALT 21 06/03/2019: BUN 12; Creatinine, Ser 0.93; Hemoglobin 6.4; Platelets 481; Potassium 3.8; Sodium 136  Recent Lipid Panel No results found for: CHOL, TRIG, HDL, CHOLHDL, VLDL, LDLCALC, LDLDIRECT  Physical Exam:    VS:  BP 100/68   Pulse 100   Ht 4\' 11"  (1.499 m)   Wt 266 lb (120.7 kg)   SpO2 99%   BMI 53.73 kg/m     Wt Readings from Last 3 Encounters:  07/26/19 266 lb (120.7 kg)  06/03/19 256 lb (116.1 kg)  04/09/19 260 lb (117.9 kg)     GEN: Patient is in no acute distress HEENT: Normal NECK: No JVD; No carotid bruits LYMPHATICS: No lymphadenopathy CARDIAC: Hear sounds regular, 2/6 systolic murmur at the apex. RESPIRATORY:  Clear to auscultation without rales, wheezing or rhonchi  ABDOMEN: Soft, non-tender, non-distended MUSCULOSKELETAL:  No edema; No deformity  SKIN: Warm and dry NEUROLOGIC:  Alert and oriented x 3 PSYCHIATRIC:  Normal affect   Signed, 04/11/19, MD  07/26/2019 4:42 PM    [facility]

## 2019-07-26 NOTE — Patient Instructions (Signed)
Medication Instructions:  No medication changes. *If you need a refill on your cardiac medications before your next appointment, please call your pharmacy*   Lab Work: None ordered If you have labs (blood work) drawn today and your tests are completely normal, you will receive your results only by: . MyChart Message (if you have MyChart) OR . A paper copy in the mail If you have any lab test that is abnormal or we need to change your treatment, we will call you to review the results.   Testing/Procedures: Your physician has requested that you have an echocardiogram. Echocardiography is a painless test that uses sound waves to create images of your heart. It provides your doctor with information about the size and shape of your heart and how well your heart's chambers and valves are working. This procedure takes approximately one hour. There are no restrictions for this procedure.     Follow-Up: At [facility], you and your health needs are our priority.  As part of our continuing mission to provide you with exceptional heart care, we have created designated Provider Care Teams.  These Care Teams include your primary Cardiologist (physician) and Advanced Practice Providers (APPs -  Physician Assistants and Nurse Practitioners) who all work together to provide you with the care you need, when you need it.  We recommend signing up for the patient portal called "MyChart".  Sign up information is provided on this After Visit Summary.  MyChart is used to connect with patients for Virtual Visits (Telemedicine).  Patients are able to view lab/test results, encounter notes, upcoming appointments, etc.  Non-urgent messages can be sent to your provider as well.   To learn more about what you can do with MyChart, go to [email or web address].    Your next appointment:   6 month(s)  The format for your next appointment:   In Person  Provider:   Rajan Revankar, MD   Other  Instructions  Echocardiogram An echocardiogram is a procedure that uses painless sound waves (ultrasound) to produce an image of the heart. Images from an echocardiogram can provide important information about:  Signs of coronary artery disease (CAD).  Aneurysm detection. An aneurysm is a weak or damaged part of an artery wall that bulges out from the normal force of blood pumping through the body.  Heart size and shape. Changes in the size or shape of the heart can be associated with certain conditions, including heart failure, aneurysm, and CAD.  Heart muscle function.  Heart valve function.  Signs of a past heart attack.  Fluid buildup around the heart.  Thickening of the heart muscle.  A tumor or infectious growth around the heart valves. Tell a health care provider about:  Any allergies you have.  All medicines you are taking, including vitamins, herbs, eye drops, creams, and over-the-counter medicines.  Any blood disorders you have.  Any surgeries you have had.  Any medical conditions you have.  Whether you are pregnant or may be pregnant. What are the risks? Generally, this is a safe procedure. However, problems may occur, including:  Allergic reaction to dye (contrast) that may be used during the procedure. What happens before the procedure? No specific preparation is needed. You may eat and drink normally. What happens during the procedure?   An IV tube may be inserted into one of your veins.  You may receive contrast through this tube. A contrast is an injection that improves the quality of the pictures from your heart.  A   gel will be applied to your chest.  A wand-like tool (transducer) will be moved over your chest. The gel will help to transmit the sound waves from the transducer.  The sound waves will harmlessly bounce off of your heart to allow the heart images to be captured in real-time motion. The images will be recorded on a computer. The  procedure may vary among health care providers and hospitals. What happens after the procedure?  You may return to your normal, everyday life, including diet, activities, and medicines, unless your health care provider tells you not to do that. Summary  An echocardiogram is a procedure that uses painless sound waves (ultrasound) to produce an image of the heart.  Images from an echocardiogram can provide important information about the size and shape of your heart, heart muscle function, heart valve function, and fluid buildup around your heart.  You do not need to do anything to prepare before this procedure. You may eat and drink normally.  After the echocardiogram is completed, you may return to your normal, everyday life, unless your health care provider tells you not to do that. This information is not intended to replace advice given to you by your health care provider. Make sure you discuss any questions you have with your health care provider. Document Revised: 08/18/2018 Document Reviewed: 05/30/2016 Elsevier Patient Education  2020 [employer]vier Inc.   

## 2019-08-01 ENCOUNTER — Ambulatory Visit (HOSPITAL_BASED_OUTPATIENT_CLINIC_OR_DEPARTMENT_OTHER)
Admission: RE | Admit: 2019-08-01 | Discharge: 2019-08-01 | Disposition: A | Discharge status: Home / Self Care | Payer: Medicaid Other | Source: Ambulatory Visit | Attending: Cardiology | Admitting: Cardiology

## 2019-08-01 ENCOUNTER — Other Ambulatory Visit: Payer: Self-pay

## 2019-08-01 DIAGNOSIS — R011 Cardiac murmur, unspecified: Secondary | ICD-10-CM | POA: Diagnosis present

## 2019-08-01 NOTE — Progress Notes (Signed)
  Echocardiogram 2D Echocardiogram has been performed.  Lisa Mcpherson 08/01/2019, 11:52 AM

## 2019-11-16 IMAGING — CR DG CHEST 2V
2 series · 2 of 2 positions shown · non-contrast
Comparison: None.

CLINICAL DATA: Chest tightness, body aches

EXAM:
CHEST - 2 VIEW

[w chest pa *]
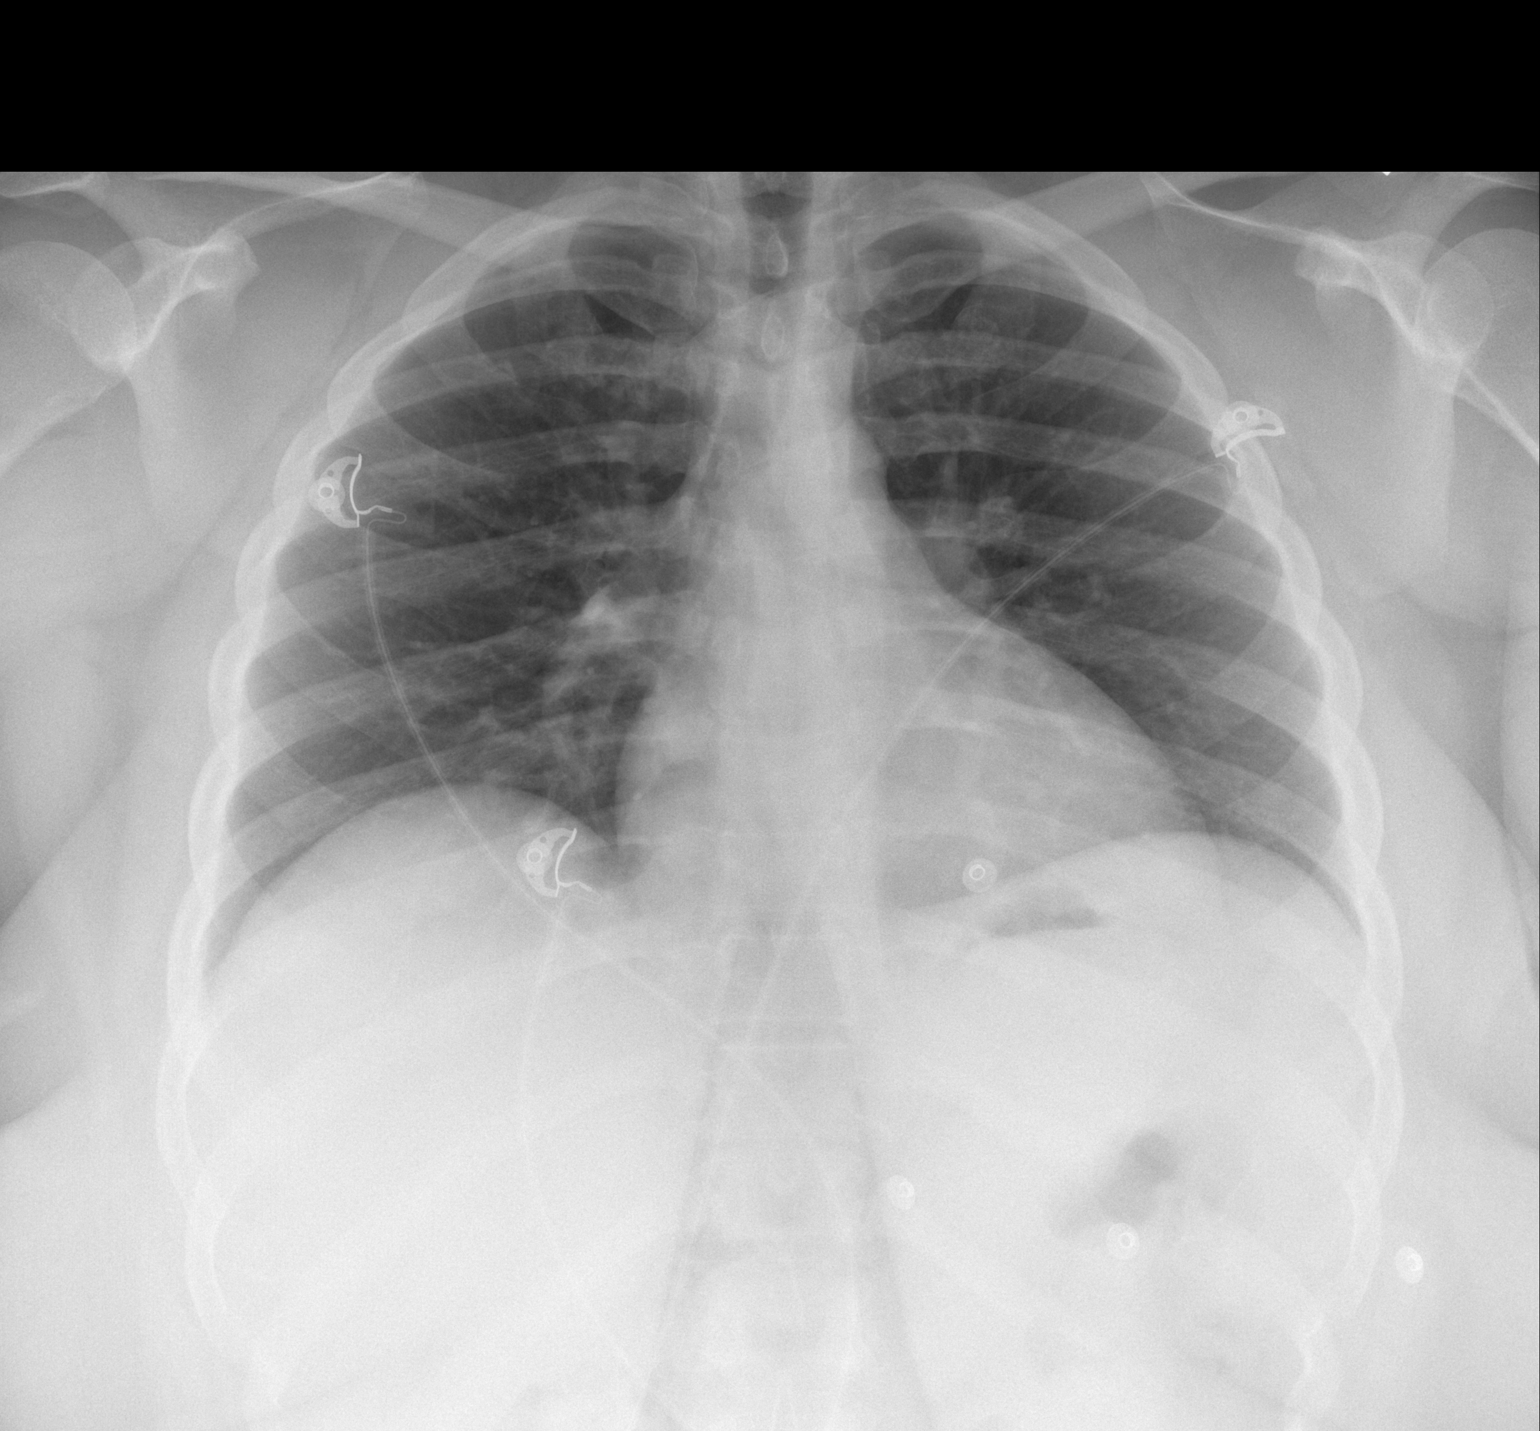

[w chest lat *]
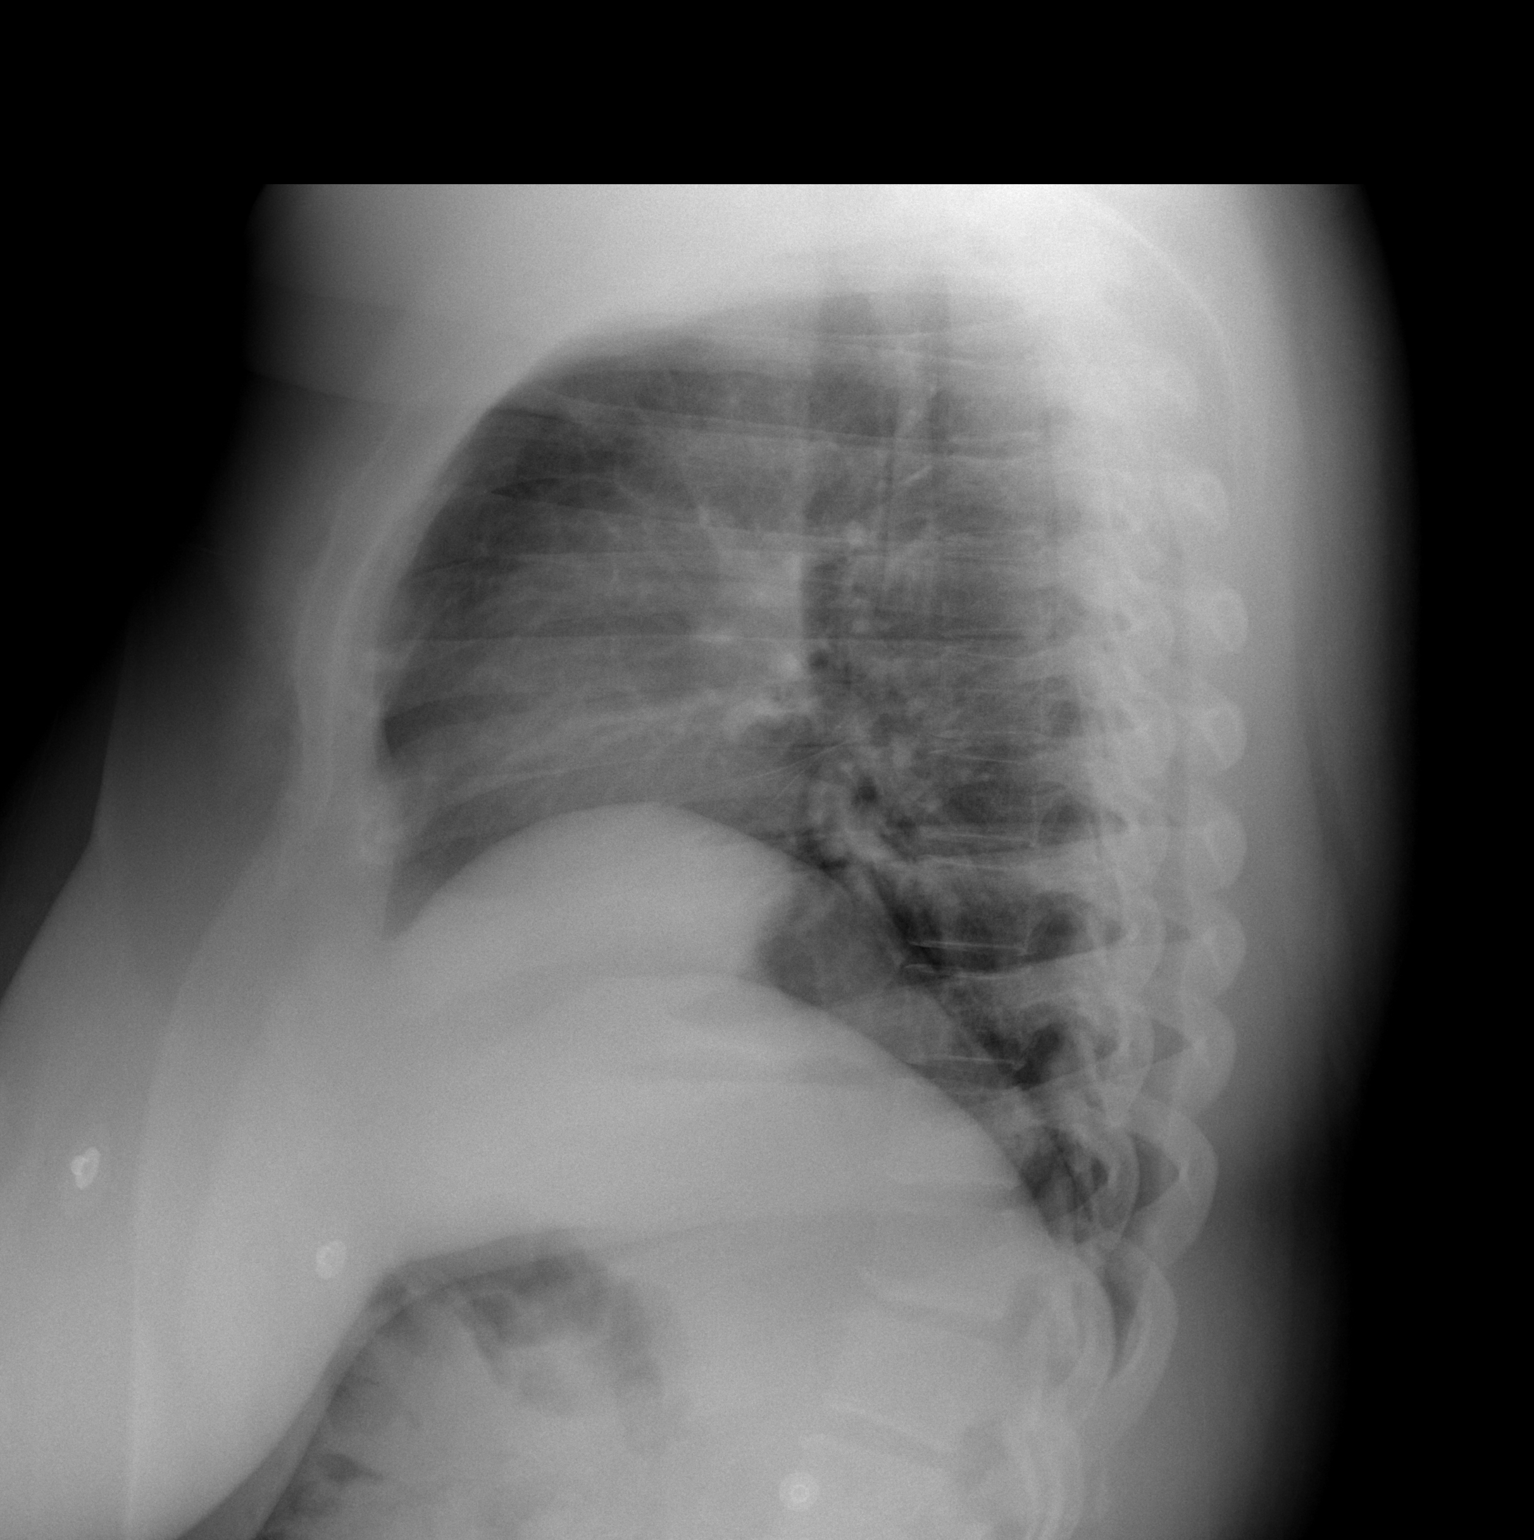

[2 of 2 positions shown; findings below may reference images not displayed]

FINDINGS: The heart size and mediastinal contours are within normal limits.
Both lungs are clear. The visualized skeletal structures are
unremarkable.
IMPRESSION: No active cardiopulmonary disease.

## 2020-01-31 ENCOUNTER — Ambulatory Visit: Payer: Medicaid Other | Admitting: Cardiology

## 2020-02-19 ENCOUNTER — Other Ambulatory Visit: Payer: Self-pay

## 2020-02-19 DIAGNOSIS — K760 Fatty (change of) liver, not elsewhere classified: Secondary | ICD-10-CM | POA: Insufficient documentation

## 2020-02-19 DIAGNOSIS — A599 Trichomoniasis, unspecified: Secondary | ICD-10-CM | POA: Insufficient documentation

## 2020-02-19 DIAGNOSIS — N390 Urinary tract infection, site not specified: Secondary | ICD-10-CM | POA: Insufficient documentation

## 2020-02-19 DIAGNOSIS — J329 Chronic sinusitis, unspecified: Secondary | ICD-10-CM | POA: Insufficient documentation

## 2020-02-19 DIAGNOSIS — R7303 Prediabetes: Secondary | ICD-10-CM | POA: Insufficient documentation

## 2020-02-19 DIAGNOSIS — E669 Obesity, unspecified: Secondary | ICD-10-CM | POA: Insufficient documentation

## 2020-02-19 DIAGNOSIS — N898 Other specified noninflammatory disorders of vagina: Secondary | ICD-10-CM | POA: Insufficient documentation

## 2020-02-19 DIAGNOSIS — R200 Anesthesia of skin: Secondary | ICD-10-CM | POA: Insufficient documentation

## 2020-02-19 DIAGNOSIS — R5383 Other fatigue: Secondary | ICD-10-CM | POA: Insufficient documentation

## 2020-02-19 DIAGNOSIS — R300 Dysuria: Secondary | ICD-10-CM | POA: Insufficient documentation

## 2020-02-19 DIAGNOSIS — A749 Chlamydial infection, unspecified: Secondary | ICD-10-CM | POA: Insufficient documentation

## 2020-02-19 DIAGNOSIS — R0602 Shortness of breath: Secondary | ICD-10-CM | POA: Insufficient documentation

## 2020-02-19 DIAGNOSIS — N912 Amenorrhea, unspecified: Secondary | ICD-10-CM | POA: Insufficient documentation

## 2020-02-19 DIAGNOSIS — R059 Cough, unspecified: Secondary | ICD-10-CM | POA: Insufficient documentation

## 2020-02-20 ENCOUNTER — Ambulatory Visit: Payer: Medicaid Other | Admitting: Cardiology

## 2020-02-28 ENCOUNTER — Emergency Department (HOSPITAL_BASED_OUTPATIENT_CLINIC_OR_DEPARTMENT_OTHER): Payer: Medicaid Other

## 2020-02-28 ENCOUNTER — Other Ambulatory Visit: Payer: Self-pay

## 2020-02-28 ENCOUNTER — Emergency Department (HOSPITAL_BASED_OUTPATIENT_CLINIC_OR_DEPARTMENT_OTHER)
Admission: EM | Admit: 2020-02-28 | Discharge: 2020-02-28 | Disposition: A | Discharge status: Home / Self Care | Payer: Medicaid Other | Attending: Emergency Medicine | Admitting: Emergency Medicine

## 2020-02-28 ENCOUNTER — Other Ambulatory Visit (HOSPITAL_COMMUNITY): Payer: Self-pay | Admitting: Family

## 2020-02-28 ENCOUNTER — Encounter (HOSPITAL_BASED_OUTPATIENT_CLINIC_OR_DEPARTMENT_OTHER): Payer: Self-pay | Admitting: Emergency Medicine

## 2020-02-28 DIAGNOSIS — R0602 Shortness of breath: Secondary | ICD-10-CM

## 2020-02-28 DIAGNOSIS — I10 Essential (primary) hypertension: Secondary | ICD-10-CM | POA: Insufficient documentation

## 2020-02-28 DIAGNOSIS — U071 COVID-19: Secondary | ICD-10-CM

## 2020-02-28 DIAGNOSIS — Z7722 Contact with and (suspected) exposure to environmental tobacco smoke (acute) (chronic): Secondary | ICD-10-CM | POA: Insufficient documentation

## 2020-02-28 DIAGNOSIS — Z79899 Other long term (current) drug therapy: Secondary | ICD-10-CM | POA: Diagnosis not present

## 2020-02-28 LAB — BASIC METABOLIC PANEL
Anion gap: 12 (ref 5–15)
BUN: 7 mg/dL (ref 6–20)
CO2: 24 mmol/L (ref 22–32)
Calcium: 8.3 mg/dL — ABNORMAL LOW (ref 8.9–10.3)
Chloride: 100 mmol/L (ref 98–111)
Creatinine, Ser: 0.98 mg/dL (ref 0.44–1.00)
GFR, Estimated: >60 mL/min (ref >60)
Glucose, Bld: 108 mg/dL — ABNORMAL HIGH (ref 70–99)
Potassium: 4 mmol/L (ref 3.5–5.1)
Sodium: 136 mmol/L (ref 135–145)

## 2020-02-28 LAB — CBC WITH DIFFERENTIAL/PLATELET
Abs Immature Granulocytes: 0.01 10*3/uL (ref 0.00–0.07)
Basophils Absolute: 0 10*3/uL (ref 0.0–0.1)
Basophils Relative: 0 %
Eosinophils Absolute: 0 10*3/uL (ref 0.0–0.5)
Eosinophils Relative: 0 %
HCT: 37 % (ref 36.0–46.0)
Hemoglobin: 11.6 g/dL — ABNORMAL LOW (ref 12.0–15.0)
Immature Granulocytes: 0 %
Lymphocytes Relative: 41 %
Lymphs Abs: 2.1 10*3/uL (ref 0.7–4.0)
MCH: 22.4 pg — ABNORMAL LOW (ref 26.0–34.0)
MCHC: 31.4 g/dL (ref 30.0–36.0)
MCV: 71.6 fL — ABNORMAL LOW (ref 80.0–100.0)
Monocytes Absolute: 0.5 10*3/uL (ref 0.1–1.0)
Monocytes Relative: 10 %
Neutro Abs: 2.5 10*3/uL (ref 1.7–7.7)
Neutrophils Relative %: 49 %
Platelets: 304 10*3/uL (ref 150–400)
RBC: 5.17 MIL/uL — ABNORMAL HIGH (ref 3.87–5.11)
RDW: 18.7 % — ABNORMAL HIGH (ref 11.5–15.5)
WBC: 5.1 10*3/uL (ref 4.0–10.5)
nRBC: 0 % (ref 0.0–0.2)

## 2020-02-28 LAB — D-DIMER, QUANTITATIVE: D-Dimer, Quant: 0.31 ug/mL-FEU (ref 0.00–0.50)

## 2020-02-28 MED ORDER — ALBUTEROL SULFATE HFA 108 (90 BASE) MCG/ACT IN AERS
2.0000 | INHALATION_SPRAY | Freq: Once | RESPIRATORY_TRACT | Status: AC
  Administered 2020-02-28: 2 via RESPIRATORY_TRACT
  Filled 2020-02-28: qty 6.7

## 2020-02-28 NOTE — Progress Notes (Signed)
I connected by phone with Lisa Mcpherson on 02/28/2020 at 5:35 PM to discuss the potential use of a new treatment for mild to moderate COVID-19 viral infection in non-hospitalized patients.  This patient is a 39 y.o. female that meets the FDA criteria for Emergency Use Authorization of COVID monoclonal antibody casirivimab/imdevimab or bamlanivimab/eteseviamb.  Has a (+) direct SARS-CoV-2 viral test result  Has mild or moderate COVID-19   Is NOT hospitalized due to COVID-19  Is within 10 days of symptom onset  Has at least one of the high risk factor(s) for progression to severe COVID-19 and/or hospitalization as defined in EUA.  Specific high risk criteria : Cardiovascular disease or hypertension   Spoke with patient, she states she felt bad on October 11th, but began having body aches, chills, SOB on 10/12.   I have spoken and communicated the following to the patient or parent/caregiver regarding COVID monoclonal antibody treatment:  1. FDA has authorized the emergency use for the treatment of mild to moderate COVID-19 in adults and pediatric patients with positive results of direct SARS-CoV-2 viral testing who are 51 years of age and older weighing at least 40 kg, and who are at high risk for progressing to severe COVID-19 and/or hospitalization.  2. The significant known and potential risks and benefits of COVID monoclonal antibody, and the extent to which such potential risks and benefits are unknown.  3. Information on available alternative treatments and the risks and benefits of those alternatives, including clinical trials.  4. Patients treated with COVID monoclonal antibody should continue to self-isolate and use infection control measures (e.g., wear mask, isolate, social distance, avoid sharing personal items, clean and disinfect "high touch" surfaces, and frequent handwashing) according to CDC guidelines.   5. The patient or parent/caregiver has the option to accept or  refuse COVID monoclonal antibody treatment.  After reviewing this information with the patient, the patient has agreed to receive one of the available covid 19 monoclonal antibodies and will be provided an appropriate fact sheet prior to infusion. Morton Stall, NP 02/28/2020 5:35 PM

## 2020-02-28 NOTE — ED Triage Notes (Signed)
Pt states she started having Covid sxs on the 11th and got tested on Saturday  Pt states it came back positive   Pt states tonight she is having shortness of breath and her chest hurts  Pt states certain ways she sits up makes her have sharp pains in her chest

## 2020-02-28 NOTE — ED Provider Notes (Signed)
[facility] EMERGENCY DEPARTMENT Provider Note   CSN: [id number] Arrival date & time: 02/28/20  0148     History Chief Complaint  Patient presents with  . Covid Positive    Lisa Mcpherson is a 39 y.o. female.  HPI     This is a 39 year old female with a history of hypertension, obesity, prediabetes who presents with shortness of breath.  Patient reports that she began to have symptoms of COVID-19 on October 11.  She tested positive this past Saturday.  Initially she was just having chills but has had progressive worsening shortness of breath.  She has a nonproductive cough.  She was febrile several days ago but that has improved.  No history of asthma or smoking.  She is also reporting some right-sided chest discomfort with coughing.  She is not vaccinated against COVID-19.  Past Medical History:  Diagnosis Date  . Amenorrhea   . Anemia   . Arm numbness   . Cardiac murmur 07/26/2019  . Chest discomfort 05/03/2018  . Chest pain 02/17/2018  . Chlamydia infection   . Cough   . Dysuria   . Essential hypertension 02/17/2018  . Fatigue   . Fatty liver   . Low TSH level   . Morbid obesity (HCC) 05/03/2018  . Obesity   . Prediabetes   . Sinusitis   . SOB (shortness of breath)   . Tachycardia 02/17/2018  . Trichomonosis   . UTI (urinary tract infection)   . Vaginal discharge     Patient Active Problem List   Diagnosis Date Noted  . Amenorrhea   . Arm numbness   . Chlamydia infection   . Cough   . Dysuria   . Fatigue   . Fatty liver   . Obesity   . Prediabetes   . Sinusitis   . SOB (shortness of breath)   . Trichomonosis   . UTI (urinary tract infection)   . Vaginal discharge   . Cardiac murmur 07/26/2019  . Chest discomfort 05/03/2018  . Morbid obesity (HCC) 05/03/2018  . Anemia 02/18/2018  . Low TSH level   . Tachycardia 02/17/2018  . Chest pain 02/17/2018  . Essential hypertension 02/17/2018    Past Surgical History:  Procedure Laterality  Date  . CESAREAN SECTION       OB History   No obstetric history on file.     Family History  Problem Relation Age of Onset  . CAD Mother   . Stroke Neg Hx     Social History   Tobacco Use  . Smoking status: Passive Smoke Exposure - Never Smoker  . Smokeless tobacco: Never Used  Vaping Use  . Vaping Use: Former  Substance Use Topics  . Alcohol use: Yes    Comment: weekends  . Drug use: No    Home Medications Prior to Admission medications   Medication Sig Start Date End Date Taking? Authorizing Provider  Cholecalciferol 25 MCG (1000 UT) tablet Take 1,000 Units by mouth daily.    [provider]  ferrous sulfate 325 (65 FE) MG tablet Take 1 tablet (325 mg total) by mouth 2 (two) times daily with a meal. 06/03/19   Geoffery Lyons, MD  ibuprofen (ADVIL) 600 MG tablet Take 1 tablet (600 mg total) by mouth every 6 (six) hours as needed. Patient not taking: Reported on 07/26/2019 04/09/19   Couture, Cortni S, PA-C  linaclotide (LINZESS) 290 MCG CAPS capsule Take 290 mcg by mouth daily as needed (constipation).  [provider]  lisinopril (PRINIVIL,ZESTRIL) 10 MG tablet Take 1 tablet (10 mg total) by mouth daily. 02/18/18 07/26/19  Gwenyth Bender, NP  megestrol (MEGACE) 40 MG tablet Take 3 tablets daily for 5 days, then 2 tablets daily for 5 days, then 1 tablet daily for 5 days. Patient not taking: Reported on 07/26/2019 06/03/19   Geoffery Lyons, MD  metoprolol succinate (TOPROL-XL) 25 MG 24 hr tablet Take 1 tablet (25 mg total) by mouth daily. Patient not taking: Reported on 05/03/2018 02/19/18   Gwenyth Bender, NP  phenazopyridine (PYRIDIUM) 200 MG tablet Take 1 tablet (200 mg total) by mouth 3 (three) times daily as needed for pain. Patient not taking: Reported on 07/26/2019 12/01/18   Molpus, John, MD  traZODone (DESYREL) 50 MG tablet Take 100 mg by mouth at bedtime. 10/27/19   [provider]    Allergies    Patient has no known allergies.  Review  of Systems   Review of Systems  Constitutional: Positive for chills and fever.  Respiratory: Positive for cough and shortness of breath.   Gastrointestinal: Negative for abdominal pain, nausea and vomiting.  Genitourinary: Negative for dysuria.  All other systems reviewed and are negative.   Physical Exam Updated Vital Signs BP 123/88 (BP Location: Left Arm)   Pulse (!) 107   Temp 99.9 F (37.7 C) (Oral)   Resp (!) 22   Ht 1.499 m (4\' 11" )   Wt 108.1 kg   LMP 02/21/2020 (Exact Date)   SpO2 98%   BMI 48.15 kg/m   Physical Exam Vitals and nursing note reviewed.  Constitutional:      Appearance: She is well-developed. She is obese. She is not ill-appearing.  HENT:     Head: Normocephalic and atraumatic.     Nose: Nose normal.     Mouth/Throat:     Mouth: Mucous membranes are moist.  Eyes:     Pupils: Pupils are equal, round, and reactive to light.  Cardiovascular:     Rate and Rhythm: Regular rhythm. Tachycardia present.     Heart sounds: Normal heart sounds.  Pulmonary:     Effort: Pulmonary effort is normal. No respiratory distress.     Breath sounds: No wheezing.     Comments: Slight tachypnea noted, no wheezing or rhonchi, distant breath sounds in all lung fields and limited by body habitus, no respiratory distress Abdominal:     General: Bowel sounds are normal.     Palpations: Abdomen is soft.  Musculoskeletal:     Cervical back: Neck supple.     Right lower leg: No edema.     Left lower leg: No edema.  Skin:    General: Skin is warm and dry.  Neurological:     Mental Status: She is alert and oriented to person, place, and time.  Psychiatric:        Mood and Affect: Mood normal.     ED Results / Procedures / Treatments   Labs (all labs ordered are listed, but only abnormal results are displayed) Labs Reviewed  CBC WITH DIFFERENTIAL/PLATELET - Abnormal; Notable for the following components:      Result Value   RBC 5.17 (*)    Hemoglobin 11.6 (*)     MCV 71.6 (*)    MCH 22.4 (*)    RDW 18.7 (*)    All other components within normal limits  BASIC METABOLIC PANEL - Abnormal; Notable for the following components:   Glucose, Bld 108 (*)  Calcium 8.3 (*)    All other components within normal limits  D-DIMER, QUANTITATIVE (NOT AT [facility])    EKG None  Radiology DG Chest Portable 1 View  Result Date: 02/28/2020 CLINICAL DATA:  Shortness of breath and chest pain. Positive COVID test. EXAM: PORTABLE CHEST 1 VIEW COMPARISON:  02/17/2018 FINDINGS: Shallow inspiration with atelectasis or infiltration in both lung bases, developing since prior study. Mild cardiac enlargement. No vascular congestion or edema. No pleural effusions. No pneumothorax. Mediastinal contours appear intact. IMPRESSION: Shallow inspiration with atelectasis or infiltration in both lung bases, developing since prior study. Electronically Signed   By: Burman Nieves M.D.   On: 02/28/2020 02:33    Procedures Procedures (including critical care time)  Medications Ordered in ED Medications  albuterol (VENTOLIN HFA) 108 (90 Base) MCG/ACT inhaler 2 puff (2 puffs Inhalation Given 02/28/20 0243)    ED Course  I have reviewed the triage vital signs and the nursing notes.  Pertinent labs & imaging results that were available during my care of the patient were reviewed by me and considered in my medical decision making (see chart for details).    MDM Rules/Calculators/A&P                          Patient presents with shortness of breath.  This is in the setting of COVID-19.  She is overall nontoxic-appearing.  Afebrile.  Vital signs notable for pulse rate of 107 respirations of 22 with an O2 sat of 98%.  Respiratory exam is somewhat limited by body habitus but was overall reassuring.  Chest x-ray obtained and shows no evidence of pneumothorax or pneumonia.  She does have some bibasilar opacities bilaterally which appear developing.  This is consistent with COVID-19.  D-dimer  was sent and is negative.  Doubt PE.  Basic lab work was obtained and is largely reassuring.  Inhaler was ordered for the patient and she did have some improvement of her shortness of breath.  Given that she is almost 10 days out from symptom onset, I offered her monoclonal antibody therapy to attempt to avoid hospitalization in the future.  At this time she declines but would like to be referred as an outpatient.  She maintains O2 sats greater than 98%.  Recommend she get a pulse oximeter as an outpatient to monitor her oxygen levels.  She will be discharged with an inhaler.  After history, exam, and medical workup I feel the patient has been appropriately medically screened and is safe for discharge home. Pertinent diagnoses were discussed with the patient. Patient was given return precautions.  Lisa Mcpherson was evaluated in Emergency Department on 02/28/2020 for the symptoms described in the history of present illness. She was evaluated in the context of the global COVID-19 pandemic, which necessitated consideration that the patient might be at risk for infection with the SARS-CoV-2 virus that causes COVID-19. Institutional protocols and algorithms that pertain to the evaluation of patients at risk for COVID-19 are in a state of rapid change based on information released by regulatory bodies including the CDC and federal and state organizations. These policies and algorithms were followed during the patient's care in the ED.  Final Clinical Impression(s) / ED Diagnoses Final diagnoses:  COVID-19  SOB (shortness of breath)    Rx / DC Orders ED Discharge Orders    None       Kemiya Batdorf, Mayer Masker, MD 02/28/20 [phone]

## 2020-02-28 NOTE — Discharge Instructions (Addendum)
You were seen today for symptoms related to COVID-19.  If your symptoms worsen you should be reevaluated.  Your work-up today is reassuring.  You may receive a call from clinic regarding monoclonal antibody therapy.  Obtain a portable pulse ox from your local drugstore to monitor your oxygen saturations.  If it drops below 90%, you should be reevaluated.

## 2020-02-29 ENCOUNTER — Ambulatory Visit (HOSPITAL_COMMUNITY)
Admission: RE | Admit: 2020-02-29 | Discharge: 2020-02-29 | Disposition: A | Discharge status: Home / Self Care | Payer: Medicaid Other | Source: Ambulatory Visit | Attending: Pulmonary Disease | Admitting: Pulmonary Disease

## 2020-02-29 DIAGNOSIS — U071 COVID-19: Secondary | ICD-10-CM | POA: Insufficient documentation

## 2020-02-29 DIAGNOSIS — I10 Essential (primary) hypertension: Secondary | ICD-10-CM | POA: Diagnosis not present

## 2020-02-29 MED ORDER — ACETAMINOPHEN 325 MG PO TABS
650.0000 mg | ORAL_TABLET | Freq: Four times a day (QID) | ORAL | Status: DC | PRN
  Administered 2020-02-29: 650 mg via ORAL
  Filled 2020-02-29: qty 2

## 2020-02-29 MED ORDER — ALBUTEROL SULFATE HFA 108 (90 BASE) MCG/ACT IN AERS: 2.0000 | INHALATION_SPRAY | Freq: Once | RESPIRATORY_TRACT | Status: DC | PRN

## 2020-02-29 MED ORDER — SODIUM CHLORIDE 0.9 % IV SOLN: Freq: Once | INTRAVENOUS | Status: AC

## 2020-02-29 MED ORDER — EPINEPHRINE 0.3 MG/0.3ML IJ SOAJ: 0.3000 mg | Freq: Once | INTRAMUSCULAR | Status: DC | PRN

## 2020-02-29 MED ORDER — SODIUM CHLORIDE 0.9 % IV SOLN: INTRAVENOUS | Status: DC | PRN

## 2020-02-29 MED ORDER — DIPHENHYDRAMINE HCL 50 MG/ML IJ SOLN: 50.0000 mg | Freq: Once | INTRAMUSCULAR | Status: DC | PRN

## 2020-02-29 MED ORDER — FAMOTIDINE IN NACL 20-0.9 MG/50ML-% IV SOLN: 20.0000 mg | Freq: Once | INTRAVENOUS | Status: DC | PRN

## 2020-02-29 MED ORDER — METHYLPREDNISOLONE SODIUM SUCC 125 MG IJ SOLR: 125.0000 mg | Freq: Once | INTRAMUSCULAR | Status: DC | PRN

## 2020-02-29 NOTE — Discharge Instructions (Signed)

## 2020-02-29 NOTE — Progress Notes (Signed)
Post infusion pt had a temp of 100.8 Tylenol 650 mg given with good result temp down to 98.4 and d/c with no complication

## 2020-02-29 NOTE — Progress Notes (Signed)
  Diagnosis: COVID-19  Physician: Dr Delford Field  Procedure: Cov[facility] Infusion Clinic Med: bamlanivimab\etesevimab infusion - Provided patient with bamlanimivab\etesevimab fact sheet for patients, parents and caregivers prior to infusion.  Complications: No immediate complications noted.  Discharge: Discharged home   Lisa Mcpherson [facility] 02/29/2020

## 2020-03-04 IMAGING — US US ABDOMEN COMPLETE
1 series · 14 of 25 positions shown · non-contrast
Comparison: None.

CLINICAL DATA: Abdominal pain for 1 month

EXAM:
ABDOMEN ULTRASOUND COMPLETE

[Series 1: us abdomen complete · 0.24mm/px · 14 of 89 slices shown]
[im 1/89]
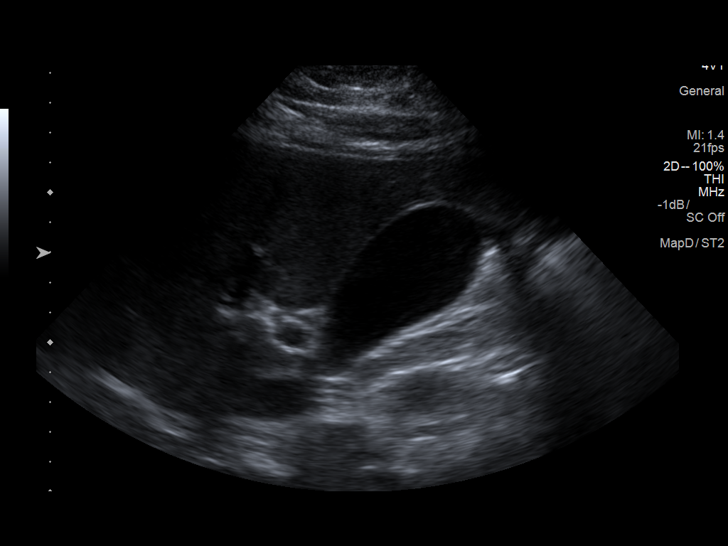
[im 8/89]
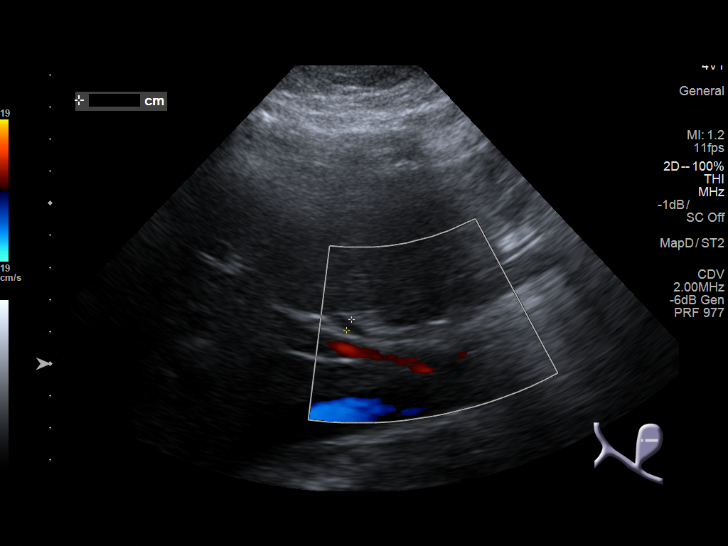
[im 15/89]
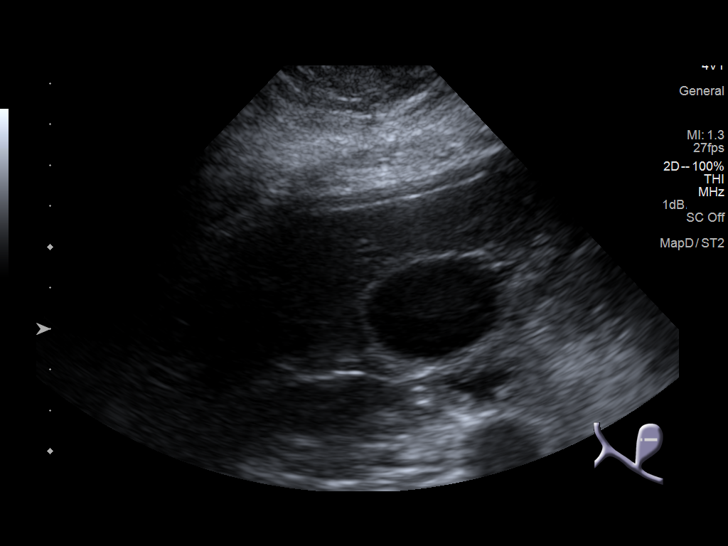
[im 23/89]
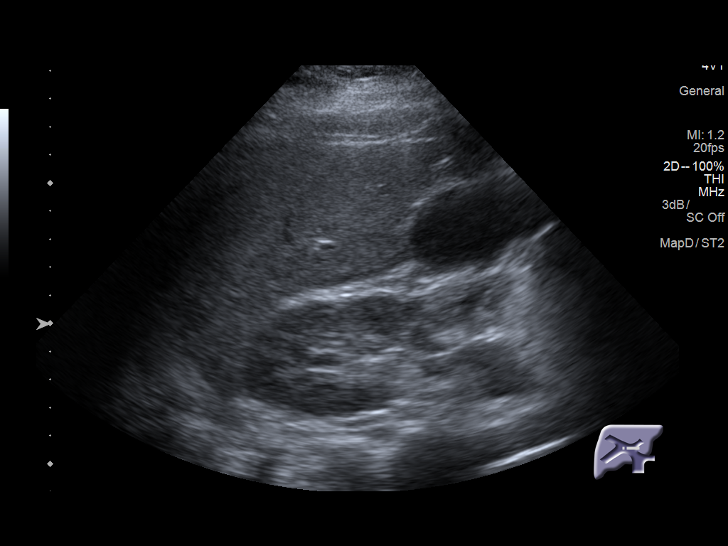
[im 30/89]
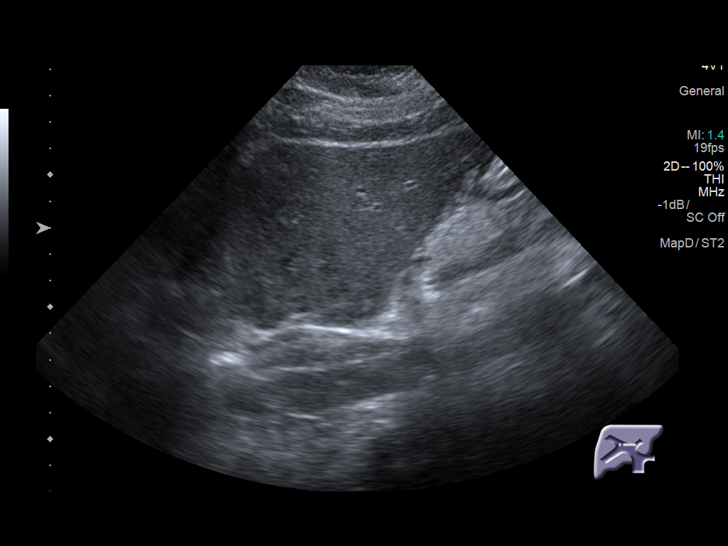
[im 34/89]
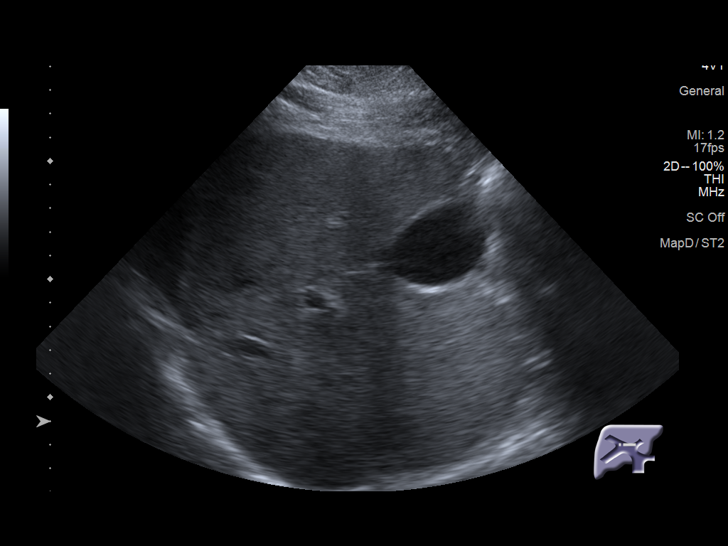
[im 41/89]
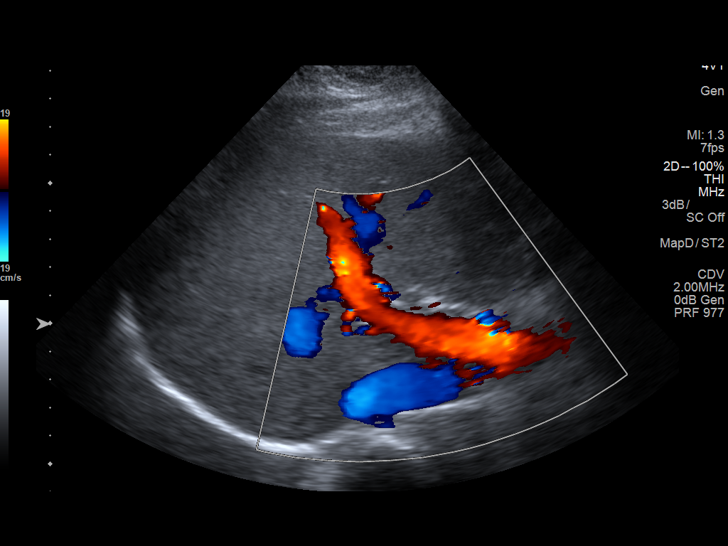
[im 48/89]
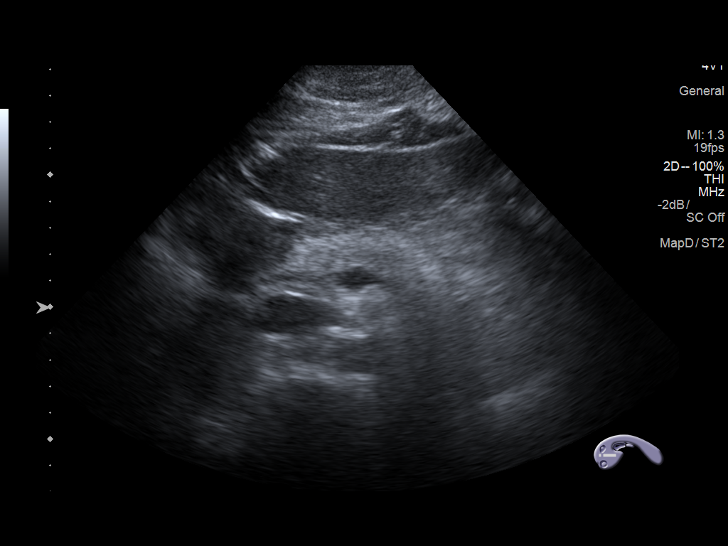
[im 56/89]
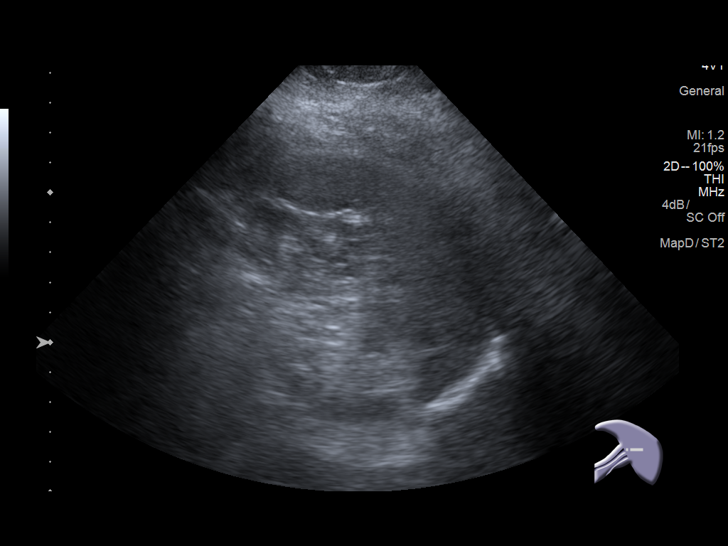
[im 59/89]
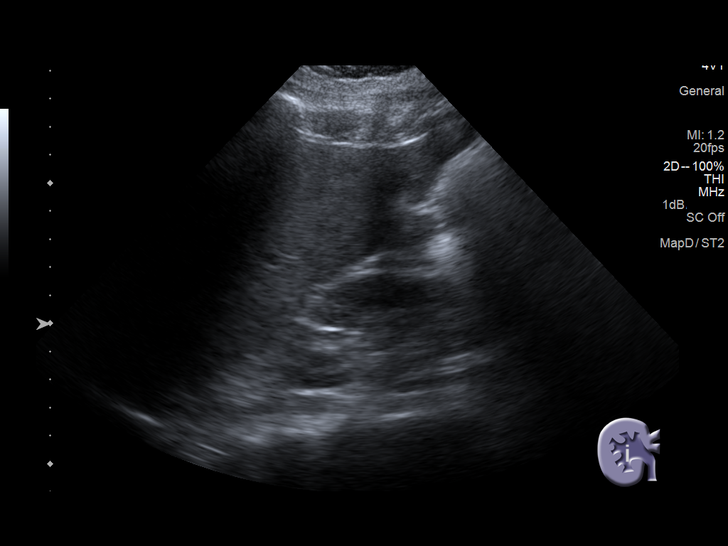
[im 67/89]
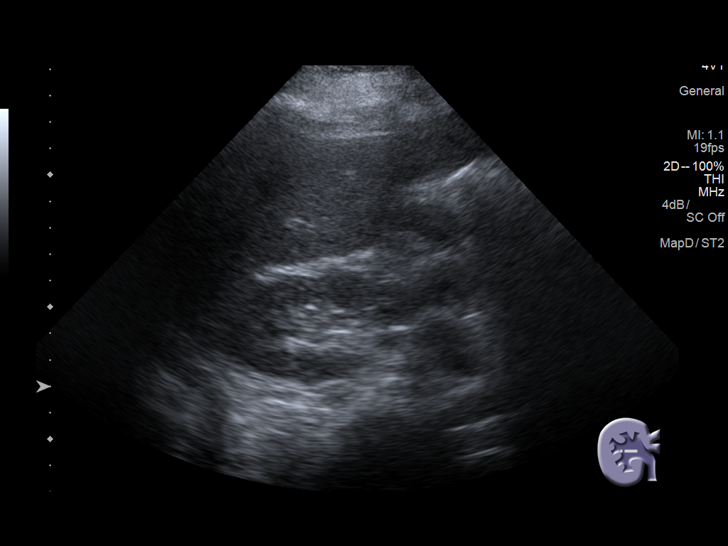
[im 74/89]
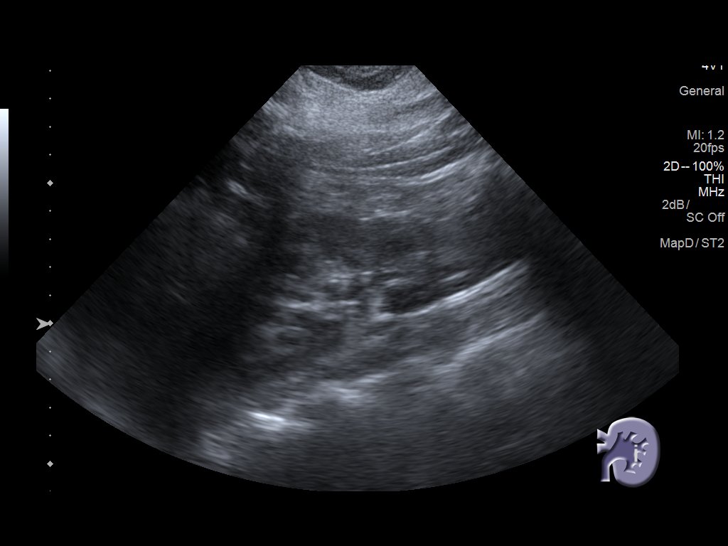
[im 81/89]
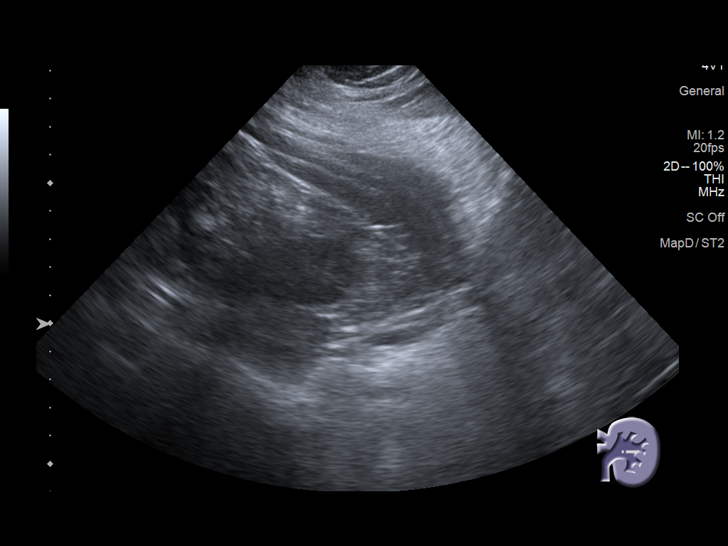
[im 89/89]
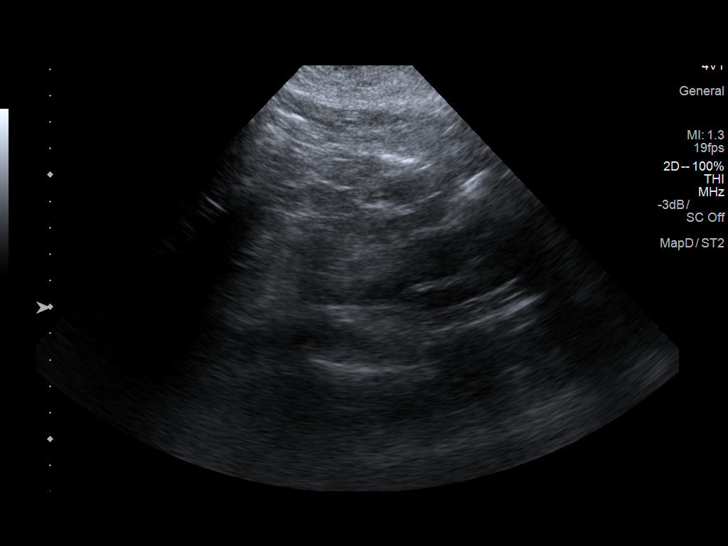

[14 of 25 positions shown; findings below may reference images not displayed]

FINDINGS: Gallbladder: No gallstones or wall thickening visualized. No
sonographic Murphy sign noted by sonographer.

Common bile duct: Diameter: 3.7 mm

Liver: No focal lesion identified. There is diffuse increased
echotexture of the liver. Portal vein is patent on color Doppler
imaging with normal direction of blood flow towards the liver.

IVC: No abnormality visualized.

Pancreas: Visualized portion unremarkable.

Spleen: Size and appearance within normal limits.

Right Kidney: Length: 12.6 cm. Echogenicity within normal limits. No
mass or hydronephrosis visualized.

Left Kidney: Length: 12.6 cm. Echogenicity within normal limits. No
mass or hydronephrosis visualized.

Abdominal aorta: No aneurysm visualized.

Other findings: None.
IMPRESSION: No acute abnormality identified. Diffuse increased echotexture of
the liver, nonspecific but can be seen in fatty infiltration of
liver.

## 2020-03-10 ENCOUNTER — Emergency Department (HOSPITAL_BASED_OUTPATIENT_CLINIC_OR_DEPARTMENT_OTHER): Payer: Medicaid Other

## 2020-03-10 ENCOUNTER — Other Ambulatory Visit: Payer: Self-pay

## 2020-03-10 ENCOUNTER — Emergency Department (HOSPITAL_BASED_OUTPATIENT_CLINIC_OR_DEPARTMENT_OTHER)
Admission: EM | Admit: 2020-03-10 | Discharge: 2020-03-10 | Disposition: A | Discharge status: Home / Self Care | Payer: Medicaid Other | Attending: Emergency Medicine | Admitting: Emergency Medicine

## 2020-03-10 ENCOUNTER — Encounter (HOSPITAL_BASED_OUTPATIENT_CLINIC_OR_DEPARTMENT_OTHER): Payer: Self-pay | Admitting: Emergency Medicine

## 2020-03-10 DIAGNOSIS — Z7722 Contact with and (suspected) exposure to environmental tobacco smoke (acute) (chronic): Secondary | ICD-10-CM | POA: Diagnosis not present

## 2020-03-10 DIAGNOSIS — R109 Unspecified abdominal pain: Secondary | ICD-10-CM | POA: Diagnosis not present

## 2020-03-10 DIAGNOSIS — R112 Nausea with vomiting, unspecified: Secondary | ICD-10-CM | POA: Insufficient documentation

## 2020-03-10 DIAGNOSIS — I10 Essential (primary) hypertension: Secondary | ICD-10-CM | POA: Insufficient documentation

## 2020-03-10 DIAGNOSIS — Z79899 Other long term (current) drug therapy: Secondary | ICD-10-CM | POA: Diagnosis not present

## 2020-03-10 LAB — COMPREHENSIVE METABOLIC PANEL
ALT: 33 U/L (ref 0–44)
AST: 31 U/L (ref 15–41)
Albumin: 3.6 g/dL (ref 3.5–5.0)
Alkaline Phosphatase: 49 U/L (ref 38–126)
Anion gap: 10 (ref 5–15)
BUN: 8 mg/dL (ref 6–20)
CO2: 27 mmol/L (ref 22–32)
Calcium: 9.1 mg/dL (ref 8.9–10.3)
Chloride: 103 mmol/L (ref 98–111)
Creatinine, Ser: 0.91 mg/dL (ref 0.44–1.00)
GFR, Estimated: >60 mL/min (ref >60)
Glucose, Bld: 113 mg/dL — ABNORMAL HIGH (ref 70–99)
Potassium: 4.3 mmol/L (ref 3.5–5.1)
Sodium: 140 mmol/L (ref 135–145)
Total Bilirubin: 0.5 mg/dL (ref 0.3–1.2)
Total Protein: 7.8 g/dL (ref 6.5–8.1)

## 2020-03-10 LAB — PREGNANCY, URINE: Preg Test, Ur: NEGATIVE

## 2020-03-10 MED ORDER — DIPHENHYDRAMINE HCL 12.5 MG/5ML PO ELIX
ORAL_SOLUTION | ORAL | Status: AC
  Administered 2020-03-10: 12.5 mg via ORAL
  Filled 2020-03-10: qty 10

## 2020-03-10 MED ORDER — DIPHENHYDRAMINE HCL 12.5 MG/5ML PO ELIX: 12.5000 mg | ORAL_SOLUTION | Freq: Once | ORAL | Status: AC

## 2020-03-10 MED ORDER — HALOPERIDOL LACTATE 5 MG/ML IJ SOLN
2.0000 mg | Freq: Once | INTRAMUSCULAR | Status: AC
  Administered 2020-03-10: 2 mg via INTRAMUSCULAR
  Filled 2020-03-10: qty 1

## 2020-03-10 NOTE — ED Notes (Signed)
Attempted blood draw x2 in right and left AC without success

## 2020-03-10 NOTE — ED Notes (Signed)
Very small sample of urine collected, will need to obtain more for culture or UA.

## 2020-03-10 NOTE — ED Triage Notes (Signed)
Right sided torso burning and 9 episodes of vomiting tonight since 2300.

## 2020-03-10 NOTE — ED Provider Notes (Signed)
[facility] EMERGENCY DEPARTMENT Provider Note   CSN: [id number] Arrival date & time: 03/10/20  0177     History Chief Complaint  Patient presents with  . Abdominal Pain  . Emesis    Lisa Mcpherson is a 39 y.o. female.  The history is provided by the patient.  Abdominal Pain Pain location: burning pain from shoulders to the pelvis on the right side  Pain radiates to:  Does not radiate Pain severity:  Severe Onset quality:  Sudden Timing:  Constant Progression:  Unchanged Chronicity:  New Context: not recent sexual activity and not recent travel   Relieved by:  Nothing Worsened by:  Nothing Ineffective treatments:  None tried Associated symptoms: nausea and vomiting   Associated symptoms: no anorexia, no belching, no chest pain, no chills, no constipation, no cough, no diarrhea, no dysuria, no fever, no flatus, no shortness of breath, no sore throat, no vaginal bleeding and no vaginal discharge   Risk factors: no alcohol abuse   Emesis Associated symptoms: abdominal pain   Associated symptoms: no arthralgias, no chills, no cough, no diarrhea, no fever and no sore throat        Past Medical History:  Diagnosis Date  . Amenorrhea   . Anemia   . Arm numbness   . Cardiac murmur 07/26/2019  . Chest discomfort 05/03/2018  . Chest pain 02/17/2018  . Chlamydia infection   . Cough   . Dysuria   . Essential hypertension 02/17/2018  . Fatigue   . Fatty liver   . Low TSH level   . Morbid obesity (HCC) 05/03/2018  . Obesity   . Prediabetes   . Sinusitis   . SOB (shortness of breath)   . Tachycardia 02/17/2018  . Trichomonosis   . UTI (urinary tract infection)   . Vaginal discharge     Patient Active Problem List   Diagnosis Date Noted  . Amenorrhea   . Arm numbness   . Chlamydia infection   . Cough   . Dysuria   . Fatigue   . Fatty liver   . Obesity   . Prediabetes   . Sinusitis   . SOB (shortness of breath)   . Trichomonosis   . UTI  (urinary tract infection)   . Vaginal discharge   . Cardiac murmur 07/26/2019  . Chest discomfort 05/03/2018  . Morbid obesity (HCC) 05/03/2018  . Anemia 02/18/2018  . Low TSH level   . Tachycardia 02/17/2018  . Chest pain 02/17/2018  . Essential hypertension 02/17/2018    Past Surgical History:  Procedure Laterality Date  . CESAREAN SECTION       OB History   No obstetric history on file.     Family History  Problem Relation Age of Onset  . CAD Mother   . Stroke Neg Hx     Social History   Tobacco Use  . Smoking status: Passive Smoke Exposure - Never Smoker  . Smokeless tobacco: Never Used  Vaping Use  . Vaping Use: Former  Substance Use Topics  . Alcohol use: Yes    Comment: weekends  . Drug use: No    Home Medications Prior to Admission medications   Medication Sig Start Date End Date Taking? Authorizing Provider  Cholecalciferol 25 MCG (1000 UT) tablet Take 1,000 Units by mouth daily.    [provider]  ferrous sulfate 325 (65 FE) MG tablet Take 1 tablet (325 mg total) by mouth 2 (two) times daily with a meal. 06/03/19  Geoffery Lyons, MD  ibuprofen (ADVIL) 600 MG tablet Take 1 tablet (600 mg total) by mouth every 6 (six) hours as needed. Patient not taking: Reported on 07/26/2019 04/09/19   Couture, Cortni S, PA-C  linaclotide (LINZESS) 290 MCG CAPS capsule Take 290 mcg by mouth daily as needed (constipation).     [provider]  lisinopril (PRINIVIL,ZESTRIL) 10 MG tablet Take 1 tablet (10 mg total) by mouth daily. 02/18/18 07/26/19  Gwenyth Bender, NP  megestrol (MEGACE) 40 MG tablet Take 3 tablets daily for 5 days, then 2 tablets daily for 5 days, then 1 tablet daily for 5 days. Patient not taking: Reported on 07/26/2019 06/03/19   Geoffery Lyons, MD  metoprolol succinate (TOPROL-XL) 25 MG 24 hr tablet Take 1 tablet (25 mg total) by mouth daily. Patient not taking: Reported on 05/03/2018 02/19/18   Gwenyth Bender, NP  phenazopyridine  (PYRIDIUM) 200 MG tablet Take 1 tablet (200 mg total) by mouth 3 (three) times daily as needed for pain. Patient not taking: Reported on 07/26/2019 12/01/18   Molpus, John, MD  traZODone (DESYREL) 50 MG tablet Take 100 mg by mouth at bedtime. 10/27/19   [provider]    Allergies    Patient has no known allergies.  Review of Systems   Review of Systems  Constitutional: Negative for chills and fever.  HENT: Negative for sore throat.   Eyes: Negative for visual disturbance.  Respiratory: Negative for cough and shortness of breath.   Cardiovascular: Negative for chest pain.  Gastrointestinal: Positive for abdominal pain, nausea and vomiting. Negative for anorexia, constipation, diarrhea and flatus.  Genitourinary: Negative for dysuria, vaginal bleeding and vaginal discharge.  Musculoskeletal: Negative for arthralgias.  Neurological: Negative for dizziness.  Psychiatric/Behavioral: Negative for agitation.  All other systems reviewed and are negative.   Physical Exam Updated Vital Signs BP (!) 145/89 (BP Location: Left Arm)   Pulse 79   Temp 98 F (36.7 C)   Resp 16   Wt 109.3 kg   LMP 02/21/2020 (Exact Date)   SpO2 100%   BMI 48.68 kg/m   Physical Exam Vitals and nursing note reviewed.  Constitutional:      General: She is not in acute distress.    Appearance: Normal appearance.  HENT:     Head: Normocephalic and atraumatic.     Nose: Nose normal.  Eyes:     Conjunctiva/sclera: Conjunctivae normal.     Pupils: Pupils are equal, round, and reactive to light.  Cardiovascular:     Rate and Rhythm: Normal rate and regular rhythm.     Pulses: Normal pulses.     Heart sounds: Normal heart sounds.  Pulmonary:     Effort: Pulmonary effort is normal.     Breath sounds: Normal breath sounds.  Abdominal:     General: Abdomen is flat. Bowel sounds are normal.     Palpations: Abdomen is soft.     Tenderness: There is no abdominal tenderness. There is no guarding or  rebound.     Hernia: No hernia is present.  Musculoskeletal:        General: Normal range of motion.     Cervical back: Normal range of motion and neck supple.  Skin:    General: Skin is warm and dry.     Capillary Refill: Capillary refill takes less than 2 seconds.     Findings: No rash.  Neurological:     General: No focal deficit present.     Mental  Status: She is alert and oriented to person, place, and time.  Psychiatric:        Mood and Affect: Mood normal.        Behavior: Behavior normal.     ED Results / Procedures / Treatments   Labs (all labs ordered are listed, but only abnormal results are displayed) Labs Reviewed  COMPREHENSIVE METABOLIC PANEL - Abnormal; Notable for the following components:      Result Value   Glucose, Bld 113 (*)    All other components within normal limits  URINE CULTURE  PREGNANCY, URINE    EKG None  Radiology CT Renal Stone Study  Result Date: 03/10/2020 CLINICAL DATA:  Right flank pain EXAM: CT ABDOMEN AND PELVIS WITHOUT CONTRAST TECHNIQUE: Multidetector CT imaging of the abdomen and pelvis was performed following the standard protocol without IV contrast. COMPARISON:  10/18/2018 FINDINGS: LOWER CHEST: Normal. HEPATOBILIARY: Normal hepatic contours. No intra- or extrahepatic biliary dilatation. The gallbladder is normal. PANCREAS: Normal pancreas. No ductal dilatation or peripancreatic fluid collection. SPLEEN: Normal. ADRENALS/URINARY TRACT: The adrenal glands are normal. No hydronephrosis, nephroureterolithiasis or solid renal mass. The urinary bladder is normal for degree of distention STOMACH/BOWEL: There is no hiatal hernia. Normal duodenal course and caliber. No small bowel dilatation or inflammation. No focal colonic abnormality. Normal appendix. VASCULAR/LYMPHATIC: Normal course and caliber of the major abdominal vessels. No abdominal or pelvic lymphadenopathy. REPRODUCTIVE: Normal uterus. No adnexal mass. MUSCULOSKELETAL. No bony  spinal canal stenosis or focal osseous abnormality. Right L5 pars interarticularis defect. OTHER: None. IMPRESSION: No acute abnormality of the abdomen or pelvis. Electronically Signed   By: Deatra RobinsonKevin  Herman M.D.   On: 03/10/2020 05:05    Procedures Procedures (including critical care time)  Medications Ordered in ED Medications  haloperidol lactate (HALDOL) injection 2 mg (2 mg Intramuscular Given 03/10/20 0312)  diphenhydrAMINE (BENADRYL) 12.5 MG/5ML elixir 12.5 mg (12.5 mg Oral Given 03/10/20 0342)    ED Course  I have reviewed the triage vital signs and the nursing notes.  Pertinent labs & imaging results that were available during my care of the patient were reviewed by me and considered in my medical decision making (see chart for details).  Differerential includes Cannabis hyperemesis.  Another possibility is shingles but there is no rash on exam.  I cannot diagnose this without the rash but have informed the patient if symptoms persist to follow up with her PMD for recheck.  The CT scan is unremarkable as is the patient's exam and labs.  She is stable for discharge with close follow up.    Lisa Mcpherson was evaluated in Emergency Department on 03/10/2020 for the symptoms described in the history of present illness. She was evaluated in the context of the global COVID-19 pandemic, which necessitated consideration that the patient might be at risk for infection with the SARS-CoV-2 virus that causes COVID-19. Institutional protocols and algorithms that pertain to the evaluation of patients at risk for COVID-19 are in a state of rapid change based on information released by regulatory bodies including the CDC and federal and state organizations. These policies and algorithms were followed during the patient's care in the ED.  Final Clinical Impression(s) / ED Diagnoses Return for intractable cough, coughing up blood,fevers >100.4 unrelieved by medication, shortness of breath, intractable  vomiting, chest pain, shortness of breath, weakness,numbness, changes in speech, facial asymmetry,abdominal pain, passing out,Inability to tolerate liquids or food, cough, altered mental status or any concerns. No signs of systemic illness or infection. The  patient is nontoxic-appearing on exam and vital signs are within normal limits.   I have reviewed the triage vital signs and the nursing notes. Pertinent labs &imaging results that were available during my care of the patient were reviewed by me and considered in my medical decision making (see chart for details).After history, exam, and medical workup I feel the patient has beenappropriately medically screened and is safe for discharge home. Pertinent diagnoses were discussed with the patient. Patient was given return precautions.    Infiniti Hoefling, MD 03/10/20 [phone]

## 2020-03-12 LAB — URINE CULTURE
Culture: 20000 — AB
Special Requests: NORMAL

## 2020-03-14 ENCOUNTER — Telehealth: Payer: Self-pay | Admitting: *Deleted

## 2020-03-14 NOTE — Telephone Encounter (Signed)
Post ED Visit - Positive Culture Follow-up  Culture report reviewed by antimicrobial stewardship pharmacist: [facility] []  , Pharm.D. []  Enzo Bi, Pharm.D., BCPS AQ-ID []  , Pharm.D., BCPS []  Celedonio Miyamoto, Pharm.D., BCPS []  Weed, Garvin Fila.D., BCPS, AAHIVP []  , Pharm.D., BCPS, AAHIVP []  Georgina Pillion, PharmD, BCPS []  , PharmD, BCPS []  [place], PharmD, BCPS []  [address], PharmD []  , PharmD, BCPS []  Estella Husk, PharmD  Pharmacy Team []  Lysle Pearl, PharmD []  , PharmD []  Phillips Climes, PharmD []  , Rph []  Agapito Games) , PharmD []  Verlan Friends, PharmD []  , PharmD []  Mervyn Gay, PharmD []  , PharmD []  Vinnie Level, PharmD []  [facility], PharmD []  , PharmD []  Len Childs, PharmD   Positive urine culture No antibiotics needed and no further patient follow-up is required at this time. , PharmD  Greer Pickerel Talley 03/14/2020, 10:44 AM

## 2020-04-09 NOTE — Progress Notes (Signed)
Your procedure is scheduled on Tuesday, April 16, 2020.  Report to [facility] Main Entrance "A" at 5:30 A.M., and check in at the Admitting office.  Call this number if you have problems the morning of surgery:  [phone]  Call [phone] if you have any questions prior to your surgery date Monday-Friday 8am-4pm    Remember:  Do not eat after midnight the night before your surgery  You may drink clear liquids until 4:30 AM the morning of your surgery.   Clear liquids allowed are: Water, Non-Citrus Juices (without pulp), Carbonated Beverages, Clear Tea, Black Coffee Only, and Gatorade    Take these medicines the morning of surgery with A SIP OF WATER:   NONE  As of today, STOP taking any Aspirin (unless otherwise instructed by your surgeon) Aleve, Naproxen, Ibuprofen, Motrin, Advil, Goody's, BC's, all herbal medications, fish oil, and all vitamins.                      Do not wear jewelry, make up, or nail polish            Do not wear lotions, powders, perfumes, or deodorant.            Do not shave 48 hours prior to surgery.            Do not bring valuables to the hospital.            [facility] is not responsible for any belongings or valuables.  Do NOT Smoke (Tobacco/Vaping) or drink Alcohol 24 hours prior to your procedure If you use a CPAP at night, you may bring all equipment for your overnight stay.   Contacts, glasses, dentures or bridgework may not be worn into surgery.      For patients admitted to the hospital, discharge time will be determined by your treatment team.   Patients discharged the day of surgery will not be allowed to drive home, and someone needs to stay with them for 24 hours.    Special instructions:   [place]- Preparing For Surgery  Before surgery, you can play an important role. Because skin is not sterile, your skin needs to be as free of germs as possible. You can reduce the number of germs on your skin by washing with CHG  (chlorahexidine gluconate) Soap before surgery.  CHG is an antiseptic cleaner which kills germs and bonds with the skin to continue killing germs even after washing.    Oral Hygiene is also important to reduce your risk of infection.  Remember - BRUSH YOUR TEETH THE MORNING OF SURGERY WITH YOUR REGULAR TOOTHPASTE  Please do not use if you have an allergy to CHG or antibacterial soaps. If your skin becomes reddened/irritated stop using the CHG.  Do not shave (including legs and underarms) for at least 48 hours prior to first CHG shower. It is OK to shave your face.  Please follow these instructions carefully.   1. Shower the NIGHT BEFORE SURGERY and the MORNING OF SURGERY with CHG Soap.   2. If you chose to wash your hair, wash your hair first as usual with your normal shampoo.  3. After you shampoo, rinse your hair and body thoroughly to remove the shampoo.  4. Use CHG as you would any other liquid soap. You can apply CHG directly to the skin and wash gently with a scrungie or a clean washcloth.   5. Apply the CHG Soap to your body ONLY FROM  THE NECK DOWN.  Do not use on open wounds or open sores. Avoid contact with your eyes, ears, mouth and genitals (private parts). Wash Face and genitals (private parts)  with your normal soap.   6. Wash thoroughly, paying special attention to the area where your surgery will be performed.  7. Thoroughly rinse your body with warm water from the neck down.  8. DO NOT shower/wash with your normal soap after using and rinsing off the CHG Soap.  9. Pat yourself dry with a CLEAN TOWEL.  10. Wear CLEAN PAJAMAS to bed the night before surgery  11. Place CLEAN SHEETS on your bed the night of your first shower and DO NOT SLEEP WITH PETS.   Day of Surgery: Wear Clean/Comfortable clothing the morning of surgery Do not apply any deodorants/lotions.   Remember to brush your teeth WITH YOUR REGULAR TOOTHPASTE.   Please read over the following fact sheets  that you were given.

## 2020-04-10 ENCOUNTER — Other Ambulatory Visit: Payer: Self-pay

## 2020-04-10 ENCOUNTER — Encounter (HOSPITAL_COMMUNITY): Payer: Self-pay

## 2020-04-10 ENCOUNTER — Encounter (HOSPITAL_COMMUNITY)
Admission: RE | Admit: 2020-04-10 | Discharge: 2020-04-10 | Disposition: A | Discharge status: Home / Self Care | Payer: Medicaid Other | Source: Ambulatory Visit | Attending: Plastic Surgery | Admitting: Plastic Surgery

## 2020-04-10 DIAGNOSIS — R011 Cardiac murmur, unspecified: Secondary | ICD-10-CM | POA: Diagnosis not present

## 2020-04-10 DIAGNOSIS — Z01818 Encounter for other preprocedural examination: Secondary | ICD-10-CM | POA: Insufficient documentation

## 2020-04-10 DIAGNOSIS — Z791 Long term (current) use of non-steroidal anti-inflammatories (NSAID): Secondary | ICD-10-CM | POA: Diagnosis not present

## 2020-04-10 DIAGNOSIS — M549 Dorsalgia, unspecified: Secondary | ICD-10-CM | POA: Diagnosis not present

## 2020-04-10 DIAGNOSIS — G8929 Other chronic pain: Secondary | ICD-10-CM | POA: Diagnosis not present

## 2020-04-10 DIAGNOSIS — Z7722 Contact with and (suspected) exposure to environmental tobacco smoke (acute) (chronic): Secondary | ICD-10-CM | POA: Insufficient documentation

## 2020-04-10 DIAGNOSIS — Z79899 Other long term (current) drug therapy: Secondary | ICD-10-CM | POA: Insufficient documentation

## 2020-04-10 DIAGNOSIS — L304 Erythema intertrigo: Secondary | ICD-10-CM | POA: Diagnosis not present

## 2020-04-10 DIAGNOSIS — Z8616 Personal history of COVID-19: Secondary | ICD-10-CM | POA: Insufficient documentation

## 2020-04-10 DIAGNOSIS — Z6841 Body Mass Index (BMI) 40.0 and over, adult: Secondary | ICD-10-CM | POA: Diagnosis not present

## 2020-04-10 DIAGNOSIS — N62 Hypertrophy of breast: Secondary | ICD-10-CM | POA: Diagnosis not present

## 2020-04-10 DIAGNOSIS — M542 Cervicalgia: Secondary | ICD-10-CM | POA: Diagnosis not present

## 2020-04-10 DIAGNOSIS — K760 Fatty (change of) liver, not elsewhere classified: Secondary | ICD-10-CM | POA: Insufficient documentation

## 2020-04-10 DIAGNOSIS — G4733 Obstructive sleep apnea (adult) (pediatric): Secondary | ICD-10-CM | POA: Insufficient documentation

## 2020-04-10 DIAGNOSIS — R000 Tachycardia, unspecified: Secondary | ICD-10-CM | POA: Insufficient documentation

## 2020-04-10 DIAGNOSIS — E119 Type 2 diabetes mellitus without complications: Secondary | ICD-10-CM | POA: Insufficient documentation

## 2020-04-10 DIAGNOSIS — R946 Abnormal results of thyroid function studies: Secondary | ICD-10-CM | POA: Insufficient documentation

## 2020-04-10 DIAGNOSIS — I10 Essential (primary) hypertension: Secondary | ICD-10-CM | POA: Diagnosis not present

## 2020-04-10 HISTORY — DX: Other specified postprocedural states: Z98.890

## 2020-04-10 HISTORY — DX: Prediabetes: R73.03

## 2020-04-10 HISTORY — DX: Sleep apnea, unspecified: G47.30

## 2020-04-10 HISTORY — DX: Other specified postprocedural states: R11.2

## 2020-04-10 LAB — CBC WITH DIFFERENTIAL/PLATELET
Abs Immature Granulocytes: 0.03 10*3/uL (ref 0.00–0.07)
Basophils Absolute: 0 10*3/uL (ref 0.0–0.1)
Basophils Relative: 0 %
Eosinophils Absolute: 0.2 10*3/uL (ref 0.0–0.5)
Eosinophils Relative: 2 %
HCT: 35.8 % — ABNORMAL LOW (ref 36.0–46.0)
Hemoglobin: 10.6 g/dL — ABNORMAL LOW (ref 12.0–15.0)
Immature Granulocytes: 0 %
Lymphocytes Relative: 37 %
Lymphs Abs: 3.5 10*3/uL (ref 0.7–4.0)
MCH: 22.6 pg — ABNORMAL LOW (ref 26.0–34.0)
MCHC: 29.6 g/dL — ABNORMAL LOW (ref 30.0–36.0)
MCV: 76.3 fL — ABNORMAL LOW (ref 80.0–100.0)
Monocytes Absolute: 0.8 10*3/uL (ref 0.1–1.0)
Monocytes Relative: 8 %
Neutro Abs: 5 10*3/uL (ref 1.7–7.7)
Neutrophils Relative %: 53 %
Platelets: 496 10*3/uL — ABNORMAL HIGH (ref 150–400)
RBC: 4.69 MIL/uL (ref 3.87–5.11)
RDW: 19 % — ABNORMAL HIGH (ref 11.5–15.5)
WBC: 9.5 10*3/uL (ref 4.0–10.5)
nRBC: 0 % (ref 0.0–0.2)

## 2020-04-10 LAB — COMPREHENSIVE METABOLIC PANEL
ALT: 22 U/L (ref 0–44)
AST: 26 U/L (ref 15–41)
Albumin: 3.7 g/dL (ref 3.5–5.0)
Alkaline Phosphatase: 61 U/L (ref 38–126)
Anion gap: 11 (ref 5–15)
BUN: 7 mg/dL (ref 6–20)
CO2: 22 mmol/L (ref 22–32)
Calcium: 9.3 mg/dL (ref 8.9–10.3)
Chloride: 105 mmol/L (ref 98–111)
Creatinine, Ser: 0.9 mg/dL (ref 0.44–1.00)
GFR, Estimated: >60 mL/min (ref >60)
Glucose, Bld: 88 mg/dL (ref 70–99)
Potassium: 3.8 mmol/L (ref 3.5–5.1)
Sodium: 138 mmol/L (ref 135–145)
Total Bilirubin: 0.4 mg/dL (ref 0.3–1.2)
Total Protein: 7.3 g/dL (ref 6.5–8.1)

## 2020-04-10 LAB — HEMOGLOBIN A1C
Hgb A1c MFr Bld: 5.6 % (ref 4.8–5.6)
Mean Plasma Glucose: 114.02 mg/dL

## 2020-04-10 NOTE — Progress Notes (Signed)
PCP - [facility] [employer] Cardiologist - patient denies  PPM/ICD - n/a Device Orders -  Rep Notified -   Chest x-ray - n/a EKG - 04/10/2020 Stress Test - patient denies ECHO - 08/01/2019 Cardiac Cath - patient denies  Sleep Study - patient is not sure when it was but stated it was done at [facility]; records requested CPAP - patient stated she had a CPAP for a while but then "they picked it up"  Fasting Blood Sugar - patient does not check CBG.  Stated her PCP said her "A1c was good and I basically take the ozempic for weight management" Checks Blood Sugar _____ times a day  Blood Thinner Instructions: n/a Aspirin Instructions:  ERAS Protcol - clears until 0430 PRE-SURGERY Ensure or G2- n/a  COVID TEST- patient tested positive 02/24/2020 with a home test, does not need to be retested prior to surgery.   Anesthesia review: yes, recent Echo, hx of heart murmur  Patient denies shortness of breath, fever, cough and chest pain at PAT appointment   All instructions explained to the patient, with a verbal understanding of the material. Patient agrees to go over the instructions while at home for a better understanding. Patient also instructed to self quarantine after being tested for COVID-19. The opportunity to ask questions was provided.

## 2020-04-10 NOTE — H&P (Signed)
Subjective:     Patient ID: Lisa Mcpherson is a 39 y.o. female.  HPI  Here for follow up discussion prior to planned breast reduction. Current DDD or G cup. Reports several year history neck and back pain. Reports associated pain shoulders with shoulder grooving from bra straps. States back pain so sever at times has been worked up for UTI or stones. Has tried muscle relaxant, Tylenol and NSAID pain medication, specialty fitted bras, hot/cold packs without relief pain. Denies numbness hands. Reports rashes beneath breasts approximately once a month, attempts to control with topical antifungal and Hydrocortisone creams, corn starch. No change in breast size over last 6 months.  Wt down 28 lb over last year, including 12 lb since last visit here 01/2020. Participating in monitored weight loss program with PCP over 3 months duration.   Notes MMG in remote past for palpable area right breast, per patient no biopsy or further work up felt benign and no longer palpable. Denies family history breast or ovarian ca.  PMH significant for heart murmur, evaluated by Cardiology 2021 and normal ECHO. History of dysfunctional uterine bleeding and anemia Hb 11.3 01/2020, on iron. 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 4 kids ages 83-22. Done having kids. Lives with her children.  Review of Systems     Objective:   Physical Exam Cardiovascular:     Rate and Rhythm: Normal rate and regular rhythm.     Heart sounds: Normal heart sounds.  Pulmonary:     Effort: Pulmonary effort is normal.  Lymphadenopathy:     Upper Body:     Right upper body: No axillary adenopathy.     Left upper body: No axillary adenopathy.  Skin:    Comments: Fitzpatrick 6     +shoulder grooving Grade 3 ptosis bilateral No masses L>R volume SN to nipple R 42 L 44 cm  BW R 24 L 25 cm Nipple to IMF R 17 L 17 cm     Assessment:     Macromastia Chronic neck and back pain Intertrigo Morbid obesity    Plan:     At her age, and in absence family history, do not require MMG prior to surgery. Reviewed screening guidelines, patient may elect to have baseline MMG prior to reduction.  Chronic neck and back pain, intertrigo that has failed conservative measures in setting of macromastia. No other cause of back pain or rashes noted. Breast reduction surgery has reasonable expectation to improve symptoms.  Reviewed reduction with anchor type scars, OP surgery, drains, post operative visits and limitations, recovery. Diminished sensation nipple and breast skin, risk of nipple loss, wound healing problems, asymmetry, incidental carcinoma, changes with wt gain/loss, aging, unacceptable cosmetic appearance reviewed. Reviewed given her breast size and SN to nipple distance she is high risk nipple loss. Reviewed free nipple graft as possibility- in this setting NAC would have no sensation not stimulate and color changes. Additional risks including but not limited to bleeding hematoma seroma fat necrosis infection damage to adjacent structures need for additional procedures blood clots in legs or lungs reviewed.  Given her BMI not candidate for surgery in ASC and requires surgery in hospital OR.  Anticipate 700 g reduction from each breast.   Drain teaching completed. Rx for Norco given. (  Patient tested positive for COVID19 approximately 10.16.21. Received bamlanivimab\etesevimab infusion. Reviewed risk COVID infectionthrough this elective surgery. COVID 19 infectionbefore/during/aftersurgery  may result in lead to a higher chance of complication and death.

## 2020-04-11 NOTE — Anesthesia Preprocedure Evaluation (Addendum)
Anesthesia Evaluation  Patient identified by MRN, date of birth, ID band Patient awake    Reviewed: Allergy & Precautions, NPO status , Patient's Chart, lab work & pertinent test results  History of Anesthesia Complications (+) PONV  Airway Mallampati: I  TM Distance: >3 FB Neck ROM: Full    Dental  (+) Dental Advisory Given, Teeth Intact   Pulmonary sleep apnea (no longer needs CPAP) , Current Smoker and Patient abstained from smoking.,  02/24/2020 COVID positive: monoclonal antibodies   breath sounds clear to auscultation       Cardiovascular hypertension (pt says her PCP discontinued her BP meds), (-) angina Rhythm:Regular Rate:Normal  07/2019 ECHO: EF 60-65%, no significant valvular abnormalities   Neuro/Psych negative neurological ROS     GI/Hepatic Neg liver ROS, GERD  Medicated and Controlled,  Endo/Other  Morbid obesity  Renal/GU negative Renal ROS     Musculoskeletal   Abdominal (+) + obese,   Peds  Hematology  (+) Blood dyscrasia (Hb 10.8), anemia ,   Anesthesia Other Findings   Reproductive/Obstetrics                          Anesthesia Physical Anesthesia Plan  ASA: III  Anesthesia Plan: General   Post-op Pain Management:    Induction: Intravenous  PONV Risk Score and Plan: 3 and Scopolamine patch - Pre-op, Dexamethasone and Ondansetron  Airway Management Planned: Oral ETT  Additional Equipment: None  Intra-op Plan:   Post-operative Plan: Extubation in OR  Informed Consent: I have reviewed the patients History and Physical, chart, labs and discussed the procedure including the risks, benefits and alternatives for the proposed anesthesia with the patient or authorized representative who has indicated his/her understanding and acceptance.     Dental advisory given  Plan Discussed with: CRNA and Surgeon  Anesthesia Plan Comments: (PAT note written 04/11/2020 by  Shonna Chock, PA-C. )     Anesthesia Quick Evaluation

## 2020-04-11 NOTE — Progress Notes (Signed)
Anesthesia Chart Review:  Case: [id number] Date/Time: 04/16/20 0715   Procedures:      MAMMARY REDUCTION  (BREAST) (Bilateral Breast)     POSSIBLE FREE NIPPLE GRAFTS (Bilateral Breast)   Anesthesia type: General   Pre-op diagnosis: macromastia, chronic neck and back pain, intertrigo   [facility]: [facility] ROOM 08 / [facility]   Surgeons: Glenna Fellows, MD      DISCUSSION: Patient is a 39 year old female scheduled for the above procedure.  History includes passive smoke exposure in a non-smoker, postoperative N/V, prediabetes, murmur (trivial TR 08/01/19), HTN (no longer on BP medication), OSA (no CPAP), fatty liver, low TSH (2019), tachycardia (2019), anemia, morbid obesity, + COVID-19 02/2020.  Patient was evaluated by cardiologist Belva Crome, MD on 07/26/19 for HTN and murmur.  Blood pressure was stable.  Echocardiogram was "unremarkable."  Presurgical COVID-19 test not scheduled as she had a positive home test on 02/24/2020 and receive monoclonal antibodies through [facility] on 02/29/20.   Anesthesia team to evaluate on the day of surgery. She will need a urine pregnancy test on the day of surgery.   VS: BP (!) 159/86   Pulse 80   Temp 36.8 C (Oral)   Resp 18   Ht 4\' 11"  (1.499 m)   Wt 109 kg   SpO2 100%   BMI 48.51 kg/m     PROVIDERS: [facility] is where she received primary care Revankar, [employer], MD is cardiologist, Last visit 07/26/19 with six month follow-up planned.    LABS: Labs reviewed: Acceptable for surgery. (all labs ordered are listed, but only abnormal results are displayed)  Labs Reviewed  CBC WITH DIFFERENTIAL/PLATELET - Abnormal; Notable for the following components:      Result Value   Hemoglobin 10.6 (*)    HCT 35.8 (*)    MCV 76.3 (*)    MCH 22.6 (*)    MCHC 29.6 (*)    RDW 19.0 (*)    Platelets 496 (*)    All other components within normal limits  HEMOGLOBIN A1C  COMPREHENSIVE METABOLIC PANEL     IMAGES: 1V CXR  02/28/20: FINDINGS: Shallow inspiration with atelectasis or infiltration in both lung bases, developing since prior study. Mild cardiac enlargement. No vascular congestion or edema. No pleural effusions. No pneumothorax. Mediastinal contours appear intact. IMPRESSION: Shallow inspiration with atelectasis or infiltration in both lung bases, developing since prior study.   EKG: 04/10/20: NSR   CV: Echo 08/01/19: IMPRESSIONS  1. Left ventricular ejection fraction, by estimation, is 60 to 65%. The  left ventricle has normal function. The left ventricle has no regional  wall motion abnormalities. Left ventricular diastolic parameters were  normal.  2. Right ventricular systolic function is normal. The right ventricular  size is normal. There is normal pulmonary artery systolic pressure.  3. The mitral valve is normal in structure. No evidence of mitral valve  regurgitation. No evidence of mitral stenosis.  4. The aortic valve is normal in structure. Aortic valve regurgitation is  not visualized. No aortic stenosis is present.  5. The inferior vena cava is normal in size with greater than 50%  respiratory variability, suggesting right atrial pressure of 3 mmHg.    Past Medical History:  Diagnosis Date  . Amenorrhea   . Anemia   . Arm numbness   . Cardiac murmur 07/26/2019  . Chest discomfort 05/03/2018  . Chest pain 02/17/2018  . Chlamydia infection   . Cough   . Dysuria   .  Essential hypertension 02/17/2018   Patient stated she has not taken BP medications in many months, "PCP stopped meds." 04/10/20  . Fatigue   . Fatty liver   . Low TSH level   . Morbid obesity (HCC) 05/03/2018  . Obesity   . PONV (postoperative nausea and vomiting)   . Pre-diabetes   . Prediabetes   . Sinusitis   . Sleep apnea    patient stated she has had a sleep study in the past but does not use a CPAP  . SOB (shortness of breath)   . Tachycardia 02/17/2018  . Trichomonosis   . UTI  (urinary tract infection)   . Vaginal discharge     Past Surgical History:  Procedure Laterality Date  . CESAREAN SECTION    . TUBAL LIGATION      MEDICATIONS: . Cholecalciferol 25 MCG (1000 UT) tablet  . ferrous sulfate 325 (65 FE) MG tablet  . ibuprofen (ADVIL) 600 MG tablet  . linaclotide (LINZESS) 290 MCG CAPS capsule  . lisinopril (PRINIVIL,ZESTRIL) 10 MG tablet  . megestrol (MEGACE) 40 MG tablet  . metoprolol succinate (TOPROL-XL) 25 MG 24 hr tablet  . phenazopyridine (PYRIDIUM) 200 MG tablet  . Semaglutide (OZEMPIC, 0.25 OR 0.5 MG/DOSE, [place])  . traZODone (DESYREL) 50 MG tablet   No current facility-administered medications for this encounter.  Not currently taking ferrous sulfate, ibuprofen, lisinopril, Toprol, Pyridium, Megace.    Shonna Chock, PA-C Surgical Short Stay/Anesthesiology [facility] Phone [phone] [facility] Phone [phone] 04/11/2020 6:13 PM

## 2020-04-16 ENCOUNTER — Ambulatory Visit (HOSPITAL_COMMUNITY)
Admission: RE | Admit: 2020-04-16 | Discharge: 2020-04-16 | Disposition: A | Discharge status: Home / Self Care | Payer: Medicaid Other | Attending: Plastic Surgery | Admitting: Plastic Surgery

## 2020-04-16 ENCOUNTER — Encounter (HOSPITAL_COMMUNITY): Payer: Self-pay | Admitting: Plastic Surgery

## 2020-04-16 ENCOUNTER — Ambulatory Visit (HOSPITAL_COMMUNITY): Payer: Medicaid Other | Admitting: Vascular Surgery

## 2020-04-16 ENCOUNTER — Encounter (HOSPITAL_COMMUNITY)
Admission: RE | Disposition: A | Discharge status: Home / Self Care | Payer: Self-pay | Source: Home / Self Care | Attending: Plastic Surgery

## 2020-04-16 ENCOUNTER — Ambulatory Visit (HOSPITAL_COMMUNITY): Payer: Medicaid Other | Admitting: Certified Registered Nurse Anesthetist

## 2020-04-16 ENCOUNTER — Other Ambulatory Visit: Payer: Self-pay

## 2020-04-16 DIAGNOSIS — L304 Erythema intertrigo: Secondary | ICD-10-CM | POA: Insufficient documentation

## 2020-04-16 DIAGNOSIS — Z8616 Personal history of COVID-19: Secondary | ICD-10-CM | POA: Insufficient documentation

## 2020-04-16 DIAGNOSIS — M549 Dorsalgia, unspecified: Secondary | ICD-10-CM | POA: Diagnosis not present

## 2020-04-16 DIAGNOSIS — M542 Cervicalgia: Secondary | ICD-10-CM | POA: Insufficient documentation

## 2020-04-16 DIAGNOSIS — E785 Hyperlipidemia, unspecified: Secondary | ICD-10-CM | POA: Insufficient documentation

## 2020-04-16 DIAGNOSIS — G8929 Other chronic pain: Secondary | ICD-10-CM | POA: Insufficient documentation

## 2020-04-16 DIAGNOSIS — G4733 Obstructive sleep apnea (adult) (pediatric): Secondary | ICD-10-CM | POA: Diagnosis not present

## 2020-04-16 DIAGNOSIS — N62 Hypertrophy of breast: Secondary | ICD-10-CM | POA: Insufficient documentation

## 2020-04-16 DIAGNOSIS — R7303 Prediabetes: Secondary | ICD-10-CM | POA: Insufficient documentation

## 2020-04-16 DIAGNOSIS — Z6841 Body Mass Index (BMI) 40.0 and over, adult: Secondary | ICD-10-CM | POA: Diagnosis not present

## 2020-04-16 DIAGNOSIS — I10 Essential (primary) hypertension: Secondary | ICD-10-CM | POA: Insufficient documentation

## 2020-04-16 HISTORY — PX: BREAST REDUCTION SURGERY: SHX8

## 2020-04-16 LAB — GLUCOSE, CAPILLARY: Glucose-Capillary: 90 mg/dL (ref 70–99)

## 2020-04-16 LAB — POCT PREGNANCY, URINE: Preg Test, Ur: NEGATIVE

## 2020-04-16 SURGERY — MAMMOPLASTY, REDUCTION
Anesthesia: General | Site: Breast | Laterality: Bilateral

## 2020-04-16 MED ORDER — LIDOCAINE 2% (20 MG/ML) 5 ML SYRINGE
INTRAMUSCULAR | Status: DC | PRN
  Administered 2020-04-16: 20 mg via INTRAVENOUS

## 2020-04-16 MED ORDER — ROCURONIUM BROMIDE 10 MG/ML (PF) SYRINGE
PREFILLED_SYRINGE | INTRAVENOUS | Status: DC | PRN
  Administered 2020-04-16 (×2): 20 mg via INTRAVENOUS
  Administered 2020-04-16: 60 mg via INTRAVENOUS

## 2020-04-16 MED ORDER — PROPOFOL 10 MG/ML IV BOLUS
INTRAVENOUS | Status: AC
  Filled 2020-04-16: qty 20

## 2020-04-16 MED ORDER — HEPARIN SODIUM (PORCINE) 5000 UNIT/ML IJ SOLN
INTRAMUSCULAR | Status: AC
  Administered 2020-04-16: 5000 [IU] via SUBCUTANEOUS
  Filled 2020-04-16: qty 1

## 2020-04-16 MED ORDER — DEXAMETHASONE SODIUM PHOSPHATE 10 MG/ML IJ SOLN
INTRAMUSCULAR | Status: DC | PRN
  Administered 2020-04-16: 5 mg via INTRAVENOUS

## 2020-04-16 MED ORDER — MIDAZOLAM HCL 2 MG/2ML IJ SOLN
INTRAMUSCULAR | Status: AC
  Filled 2020-04-16: qty 2

## 2020-04-16 MED ORDER — MIDAZOLAM HCL 2 MG/2ML IJ SOLN: 0.5000 mg | Freq: Once | INTRAMUSCULAR | Status: DC | PRN

## 2020-04-16 MED ORDER — BUPIVACAINE HCL (PF) 0.25 % IJ SOLN
INTRAMUSCULAR | Status: AC
  Filled 2020-04-16: qty 30

## 2020-04-16 MED ORDER — CHLORHEXIDINE GLUCONATE CLOTH 2 % EX PADS: 6.0000 | MEDICATED_PAD | Freq: Once | CUTANEOUS | Status: DC

## 2020-04-16 MED ORDER — LACTATED RINGERS IV SOLN: INTRAVENOUS | Status: DC

## 2020-04-16 MED ORDER — FENTANYL CITRATE (PF) 250 MCG/5ML IJ SOLN
INTRAMUSCULAR | Status: AC
  Filled 2020-04-16: qty 5

## 2020-04-16 MED ORDER — ACETAMINOPHEN 500 MG PO TABS
ORAL_TABLET | ORAL | Status: AC
  Administered 2020-04-16: 1000 mg via ORAL
  Filled 2020-04-16: qty 2

## 2020-04-16 MED ORDER — MEPERIDINE HCL 25 MG/ML IJ SOLN: 6.2500 mg | INTRAMUSCULAR | Status: DC | PRN

## 2020-04-16 MED ORDER — ORAL CARE MOUTH RINSE: 15.0000 mL | Freq: Once | OROMUCOSAL | Status: AC

## 2020-04-16 MED ORDER — ROCURONIUM BROMIDE 10 MG/ML (PF) SYRINGE
PREFILLED_SYRINGE | INTRAVENOUS | Status: AC
  Filled 2020-04-16: qty 10

## 2020-04-16 MED ORDER — PROMETHAZINE HCL 25 MG/ML IJ SOLN: 6.2500 mg | INTRAMUSCULAR | Status: DC | PRN

## 2020-04-16 MED ORDER — HYDROMORPHONE HCL 1 MG/ML IJ SOLN
INTRAMUSCULAR | Status: AC
  Filled 2020-04-16: qty 1

## 2020-04-16 MED ORDER — CHLORHEXIDINE GLUCONATE 0.12 % MT SOLN
OROMUCOSAL | Status: AC
  Filled 2020-04-16: qty 15

## 2020-04-16 MED ORDER — HYDROMORPHONE HCL 1 MG/ML IJ SOLN
0.2500 mg | INTRAMUSCULAR | Status: DC | PRN
  Administered 2020-04-16 (×2): 0.5 mg via INTRAVENOUS

## 2020-04-16 MED ORDER — HYDROMORPHONE HCL 1 MG/ML IJ SOLN
INTRAMUSCULAR | Status: AC
  Filled 2020-04-16: qty 0.5

## 2020-04-16 MED ORDER — OXYCODONE HCL 5 MG/5ML PO SOLN: 5.0000 mg | Freq: Once | ORAL | Status: DC | PRN

## 2020-04-16 MED ORDER — BUPIVACAINE LIPOSOME 1.3 % IJ SUSP
20.0000 mL | INTRAMUSCULAR | Status: DC
  Filled 2020-04-16: qty 20

## 2020-04-16 MED ORDER — PROPOFOL 10 MG/ML IV BOLUS
INTRAVENOUS | Status: DC | PRN
  Administered 2020-04-16: 200 mg via INTRAVENOUS

## 2020-04-16 MED ORDER — HYDROMORPHONE HCL 1 MG/ML IJ SOLN
INTRAMUSCULAR | Status: DC | PRN
Start: 2020-04-16 — End: 2020-04-16
  Administered 2020-04-16 (×2): .5 mg via INTRAVENOUS

## 2020-04-16 MED ORDER — LIDOCAINE HCL (PF) 2 % IJ SOLN
INTRAMUSCULAR | Status: AC
  Filled 2020-04-16: qty 5

## 2020-04-16 MED ORDER — SUGAMMADEX SODIUM 200 MG/2ML IV SOLN
INTRAVENOUS | Status: DC | PRN
  Administered 2020-04-16: 200 mg via INTRAVENOUS

## 2020-04-16 MED ORDER — HEPARIN SODIUM (PORCINE) 5000 UNIT/ML IJ SOLN: 5000.0000 [IU] | Freq: Once | INTRAMUSCULAR | Status: AC

## 2020-04-16 MED ORDER — SCOPOLAMINE 1 MG/3DAYS TD PT72
1.0000 | MEDICATED_PATCH | TRANSDERMAL | Status: DC
  Administered 2020-04-16: 1.5 mg via TRANSDERMAL
  Filled 2020-04-16: qty 1

## 2020-04-16 MED ORDER — CEFAZOLIN SODIUM-DEXTROSE 2-4 GM/100ML-% IV SOLN
2.0000 g | INTRAVENOUS | Status: AC
  Administered 2020-04-16: 2 g via INTRAVENOUS

## 2020-04-16 MED ORDER — ONDANSETRON HCL 4 MG/2ML IJ SOLN
INTRAMUSCULAR | Status: DC | PRN
  Administered 2020-04-16: 4 mg via INTRAVENOUS

## 2020-04-16 MED ORDER — WATER FOR IRRIGATION, STERILE IR SOLN
Status: DC | PRN
  Administered 2020-04-16: 1000 mL

## 2020-04-16 MED ORDER — CELECOXIB 200 MG PO CAPS
ORAL_CAPSULE | ORAL | Status: AC
  Administered 2020-04-16: 200 mg via ORAL
  Filled 2020-04-16: qty 1

## 2020-04-16 MED ORDER — FENTANYL CITRATE (PF) 250 MCG/5ML IJ SOLN
INTRAMUSCULAR | Status: DC | PRN
  Administered 2020-04-16: 100 ug via INTRAVENOUS
  Administered 2020-04-16 (×8): 50 ug via INTRAVENOUS

## 2020-04-16 MED ORDER — CHLORHEXIDINE GLUCONATE 0.12 % MT SOLN
15.0000 mL | Freq: Once | OROMUCOSAL | Status: AC
  Administered 2020-04-16: 15 mL via OROMUCOSAL

## 2020-04-16 MED ORDER — MIDAZOLAM HCL 5 MG/5ML IJ SOLN
INTRAMUSCULAR | Status: DC | PRN
  Administered 2020-04-16: 2 mg via INTRAVENOUS

## 2020-04-16 MED ORDER — 0.9 % SODIUM CHLORIDE (POUR BTL) OPTIME
TOPICAL | Status: DC | PRN
  Administered 2020-04-16: 1000 mL

## 2020-04-16 MED ORDER — CELECOXIB 200 MG PO CAPS: 200.0000 mg | ORAL_CAPSULE | ORAL | Status: AC

## 2020-04-16 MED ORDER — OXYCODONE HCL 5 MG PO TABS: 5.0000 mg | ORAL_TABLET | Freq: Once | ORAL | Status: DC | PRN

## 2020-04-16 MED ORDER — SODIUM CHLORIDE 0.9 % IV SOLN
INTRAVENOUS | Status: DC | PRN
  Administered 2020-04-16 (×2): 20 mL

## 2020-04-16 MED ORDER — ACETAMINOPHEN 500 MG PO TABS: 1000.0000 mg | ORAL_TABLET | ORAL | Status: AC

## 2020-04-16 SURGICAL SUPPLY — 44 items
BINDER BREAST LRG (GAUZE/BANDAGES/DRESSINGS) IMPLANT
BINDER BREAST MEDIUM (GAUZE/BANDAGES/DRESSINGS) IMPLANT
BINDER BREAST XLRG (GAUZE/BANDAGES/DRESSINGS) IMPLANT
BINDER BREAST XXLRG (GAUZE/BANDAGES/DRESSINGS) ×2 IMPLANT
BLADE SURG 10 STRL SS (BLADE) ×10 IMPLANT
CANISTER SUCT 3000ML PPV (MISCELLANEOUS) ×2 IMPLANT
CHLORAPREP W/TINT 26 (MISCELLANEOUS) ×2 IMPLANT
COVER SURGICAL LIGHT HANDLE (MISCELLANEOUS) ×2 IMPLANT
COVER WAND RF STERILE (DRAPES) ×2 IMPLANT
DECANTER SPIKE VIAL GLASS SM (MISCELLANEOUS) ×2 IMPLANT
DERMABOND ADVANCED (GAUZE/BANDAGES/DRESSINGS) ×5
DERMABOND ADVANCED .7 DNX12 (GAUZE/BANDAGES/DRESSINGS) ×5 IMPLANT
DRAIN CHANNEL 19F RND (DRAIN) ×4 IMPLANT
DRAPE HALF SHEET 40X57 (DRAPES) ×4 IMPLANT
DRAPE ORTHO SPLIT 77X108 STRL (DRAPES) ×2
DRAPE SURG ORHT 6 SPLT 77X108 (DRAPES) ×2 IMPLANT
DRAPE WARM FLUID 44X44 (DRAPES) ×2 IMPLANT
DRSG PAD ABDOMINAL 8X10 ST (GAUZE/BANDAGES/DRESSINGS) ×8 IMPLANT
ELECT BLADE 4.0 EZ CLEAN MEGAD (MISCELLANEOUS) ×2
ELECT COATED BLADE 2.86 ST (ELECTRODE) ×2 IMPLANT
ELECT REM PT RETURN 9FT ADLT (ELECTROSURGICAL) ×2
ELECTRODE BLDE 4.0 EZ CLN MEGD (MISCELLANEOUS) ×1 IMPLANT
ELECTRODE REM PT RTRN 9FT ADLT (ELECTROSURGICAL) ×1 IMPLANT
EVACUATOR SILICONE 100CC (DRAIN) ×4 IMPLANT
GLOVE BIO SURGEON STRL SZ 6 (GLOVE) ×8 IMPLANT
GOWN STRL REUS W/ TWL LRG LVL3 (GOWN DISPOSABLE) ×2 IMPLANT
GOWN STRL REUS W/TWL LRG LVL3 (GOWN DISPOSABLE) ×2
KIT SUCTION CATH 14FR (SUCTIONS) ×2 IMPLANT
MARKER SKIN DUAL TIP RULER LAB (MISCELLANEOUS) ×2 IMPLANT
NEEDLE HYPO 25GX1X1/2 BEV (NEEDLE) ×2 IMPLANT
NS IRRIG 1000ML POUR BTL (IV SOLUTION) ×2 IMPLANT
PACK GENERAL/GYN (CUSTOM PROCEDURE TRAY) ×2 IMPLANT
PIN SAFETY STERILE (MISCELLANEOUS) ×2 IMPLANT
SPONGE LAP 18X18 RF (DISPOSABLE) ×4 IMPLANT
STAPLER VISISTAT 35W (STAPLE) ×4 IMPLANT
SUT ETHILON 2 0 FS 18 (SUTURE) ×2 IMPLANT
SUT MNCRL AB 4-0 PS2 18 (SUTURE) ×8 IMPLANT
SUT VIC AB 3-0 PS1 18 (SUTURE) ×7
SUT VIC AB 3-0 PS1 18XBRD (SUTURE) ×7 IMPLANT
SUT VIC AB 4-0 PS2 18 (SUTURE) ×4 IMPLANT
SUT VICRYL 4-0 PS2 18IN ABS (SUTURE) IMPLANT
SYR CONTROL 10ML LL (SYRINGE) ×2 IMPLANT
TOWEL GREEN STERILE (TOWEL DISPOSABLE) ×2 IMPLANT
WATER STERILE IRR 1000ML POUR (IV SOLUTION) ×2 IMPLANT

## 2020-04-16 NOTE — Anesthesia Procedure Notes (Signed)
Procedure Name: Intubation Date/Time: 04/16/2020 7:34 AM Performed by: Waynard Edwards, CRNA Pre-anesthesia Checklist: Patient identified, Emergency Drugs available, Suction available and Patient being monitored Patient Re-evaluated:Patient Re-evaluated prior to induction Oxygen Delivery Method: Circle system utilized Preoxygenation: Pre-oxygenation with 100% oxygen Induction Type: IV induction Ventilation: Mask ventilation without difficulty Laryngoscope Size: Miller and 2 Grade View: Grade I Tube type: Oral Tube size: 7.0 mm Number of attempts: 1 Airway Equipment and Method: Stylet and Oral airway Placement Confirmation: ETT inserted through vocal cords under direct vision,  positive ETCO2 and breath sounds checked- equal and bilateral Secured at: 22 cm Tube secured with: Tape Dental Injury: Teeth and Oropharynx as per pre-operative assessment

## 2020-04-16 NOTE — Anesthesia Postprocedure Evaluation (Signed)
Anesthesia Post Note  Patient: Astronomer  Procedure(s) Performed: MAMMARY REDUCTION  (BREAST) (Bilateral Breast)     Patient location during evaluation: PACU Anesthesia Type: General Level of consciousness: awake and alert, patient cooperative and oriented Pain management: pain level controlled Vital Signs Assessment: post-procedure vital signs reviewed and stable Respiratory status: spontaneous breathing, nonlabored ventilation and respiratory function stable Cardiovascular status: blood pressure returned to baseline and stable Postop Assessment: no apparent nausea or vomiting and able to ambulate Anesthetic complications: no   No complications documented.  Last Vitals:  Vitals:   04/16/20 1245 04/16/20 1248  BP: 115/76   Pulse: 94 78  Resp: 18 15  Temp:  36.6 C  SpO2: 98% 99%    Last Pain:  Vitals:   04/16/20 1230  TempSrc:   PainSc: 9                  Lylian Sanagustin,E. Godric Lavell

## 2020-04-16 NOTE — Transfer of Care (Signed)
Immediate Anesthesia Transfer of Care Note  Patient: Astronomer  Procedure(s) Performed: MAMMARY REDUCTION  (BREAST) (Bilateral Breast)  Patient Location: PACU  Anesthesia Type:General  Level of Consciousness: drowsy  Airway & Oxygen Therapy: Patient Spontanous Breathing and Patient connected to nasal cannula oxygen  Post-op Assessment: Report given to RN and Post -op Vital signs reviewed and stable  Post vital signs: Reviewed and stable  Last Vitals:  Vitals Value Taken Time  BP 123/73 04/16/20 1128  Temp    Pulse 94 04/16/20 1128  Resp 19 04/16/20 1128  SpO2 100 % 04/16/20 1128  Vitals shown include unvalidated device data.  Last Pain:  Vitals:   04/16/20 0622  TempSrc:   PainSc: 0-No pain      Patients Stated Pain Goal: 2 (04/16/20 0622)  Complications: No complications documented.

## 2020-04-16 NOTE — Interval H&P Note (Signed)
History and Physical Interval Note:  04/16/2020 6:50 AM  Lisa Mcpherson  has presented today for surgery, with the diagnosis of macromastia, chronic neck and back pain, intertrigo.  The various methods of treatment have been discussed with the patient and family. After consideration of risks, benefits and other options for treatment, the patient has consented to  Procedure(s): MAMMARY REDUCTION  (BREAST) (Bilateral) POSSIBLE FREE NIPPLE GRAFTS (Bilateral) as a surgical intervention.  The patient's history has been reviewed, patient examined, no change in status, stable for surgery.  I have reviewed the patient's chart and labs.  Questions were answered to the patient's satisfaction.     [name]

## 2020-04-16 NOTE — Op Note (Signed)
Operative Note   DATE OF OPERATION: 12.7.21  LOCATION: [facility]-outpatient  SURGICAL DIVISION: Plastic Surgery  PREOPERATIVE DIAGNOSES:  1. Macromastia 2. Chronic neck and back pain 3. Intertrigo  POSTOPERATIVE DIAGNOSES:  same  PROCEDURE:  Bilateral breast reduction  SURGEON: Glenna Fellows MD MBA  ASSISTANT: Tarry Kos RNFA  ANESTHESIA:  General.   EBL: 150 ml  COMPLICATIONS: None immediate.   INDICATIONS FOR PROCEDURE:  The patient, Lisa Mcpherson, is a 39 y.o. female born on 04/20/1981, is here for treatment of chronic neck and back pain, intertrigo that has failed conservative measures in setting of macromastia.   FINDINGS: Right reduction 1010 g Left reduction 1303 g  DESCRIPTION OF PROCEDURE:  The patient was marked standing in the preoperative area to mark sternal notch, chest midline, anterior axillary lines, inframammary folds. The location of new nipple areolar complex was marked at level of on inframammary fold on anterior surface breast by palpation. This was marked symmetric over bilateral breasts. With aid of Wise pattern marker, location of new nipple areolar complex and vertical limbs (7cm) were markedby displacement of breasts along meridian. SQ heparin administered. The patient was taken to the operating room. SCDs were placedand IV antibiotics were given. Foley catheter placed. The patient's operative site was prepped and draped in a sterile fashion. A time out was performed and all information was confirmed to be correct.   Over left breast, superomedial pedicle marked and nipple areolar complexincisedwith7mm diameter marker. Pedicle deepithlialized and developedto chest wall. Breast tissue resected over lower pole. Medial and lateral flaps developed.Additionalsuperior pole andlateral chest wall tissue excised.Breast tailor tacked closed.  I then directed attention to right breast where superomedial pedicle designed.NAC marked with58mm  diameter marker.The pedicle was deepithelialized. Pedicle developed until tension free rotation was possible.Breast tissue resected over lower pole. Medial and lateral flaps developed.Additionalsuperior pole andlateral chest wall tissue excised. Breast tailor tacked closed, and patient brought to upright sitting position and assessed for symmetry. Patent returned to supine position. Breast cavities irrigated and hemostasis obtained.Local anestheticinfiltrated throughout each breast. 19 Fr JP placed in each breast and secured with 2-0 nylon. Closure completed bilateralwith 3-0 vicryl to approximate dermis along inframammary fold and vertical limb. NAC inset with 4-0 vicryl in dermis. Skin closure completed with 4-0 monocryl subcuticular throughout. Tissue adhesive applied. Dry dressing and breast binder applied.  The patient was allowed to wake from anesthesia, extubated and taken to the recovery room in satisfactory condition.   SPECIMENS: right and left breast reduction  DRAINS: 19 Fr JP in right and left breast

## 2020-04-17 ENCOUNTER — Encounter (HOSPITAL_COMMUNITY): Payer: Self-pay | Admitting: Plastic Surgery

## 2020-04-17 LAB — SURGICAL PATHOLOGY

## 2020-07-16 ENCOUNTER — Ambulatory Visit: Payer: Medicaid Other | Attending: Internal Medicine

## 2020-07-16 DIAGNOSIS — Z23 Encounter for immunization: Secondary | ICD-10-CM

## 2020-07-16 NOTE — Progress Notes (Signed)
   Covid-19 Vaccination Clinic  Name:  Lisa Mcpherson    MRN: [id number] DOB: 24-Dec-1980  07/16/2020  Lisa Mcpherson was observed post Covid-19 immunization for 15 minutes without incident. She was provided with Vaccine Information Sheet and instruction to access the V-Safe system.   Lisa Mcpherson was instructed to call 911 with any severe reactions post vaccine: Marland Kitchen Difficulty breathing  . Swelling of face and throat  . A fast heartbeat  . A bad rash all over body  . Dizziness and weakness   Immunizations Administered    Name Date Dose VIS Date Route   PFIZER Comrnaty(Gray TOP) Covid-19 Vaccine 07/16/2020 10:15 AM 0.3 mL 04/18/2020 Intramuscular   Manufacturer: [employer], [employer]   Lot: [id number]   NDC: [phone]

## 2020-08-06 ENCOUNTER — Other Ambulatory Visit (HOSPITAL_COMMUNITY): Payer: Self-pay | Admitting: Internal Medicine

## 2020-08-06 ENCOUNTER — Ambulatory Visit: Payer: Medicaid Other | Attending: Internal Medicine

## 2020-08-06 DIAGNOSIS — Z23 Encounter for immunization: Secondary | ICD-10-CM

## 2020-08-06 MED FILL — PFIZER-BIONT COVID-19 VAC-T: 30 | 21 days supply | Qty: 0 | Fill #0

## 2020-08-06 NOTE — Progress Notes (Signed)
   Covid-19 Vaccination Clinic  Name:  Lisa Mcpherson    MRN: [id number] DOB: October 18, 1980  08/06/2020  [name] was observed post Covid-19 immunization for 15 minutes without incident. She was provided with Vaccine Information Sheet and instruction to access the V-Safe system.   [name] was instructed to call 911 with any severe reactions post vaccine: Marland Kitchen Difficulty breathing  . Swelling of face and throat  . A fast heartbeat  . A bad rash all over body  . Dizziness and weakness   Immunizations Administered    Name Date Dose VIS Date Route   PFIZER Comrnaty(Gray TOP) Covid-19 Vaccine 08/06/2020 11:16 AM 0.3 mL 04/18/2020 Intramuscular   Manufacturer: [employer], [employer]   Lot: [id number]   NDC: [phone]

## 2021-12-07 IMAGING — CT CT RENAL STONE PROTOCOL
2 of 4 series · 17 of 46 positions shown, 19 images · non-contrast
Comparison: 10/18/2018

CLINICAL DATA: Right flank pain

EXAM:
CT ABDOMEN AND PELVIS WITHOUT CONTRAST
TECHNIQUE: Multidetector CT imaging of the abdomen and pelvis was performed
following the standard protocol without IV contrast.

[Series 2: axial st · axial · 0.71mm/px · z∈[+623,+1038]mm · 14 of 91 slices shown, 16 images]
[im 4/91  soft-tissue]
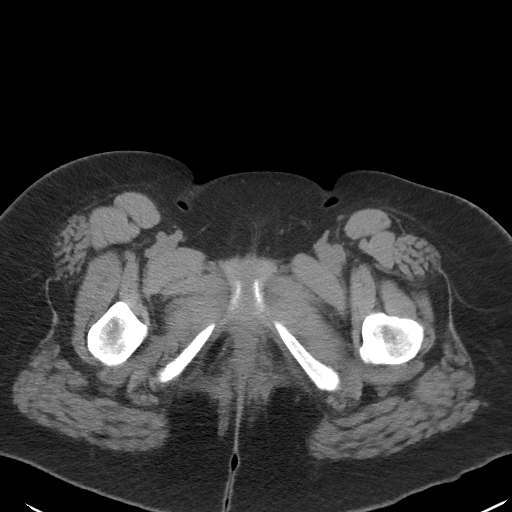
[im 4/91  bone]
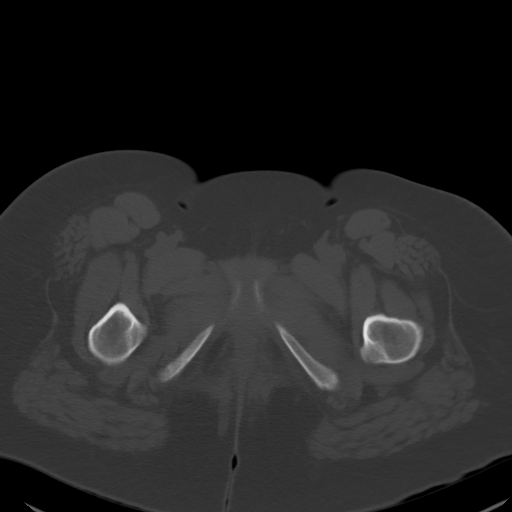
[im 12/91  soft-tissue]
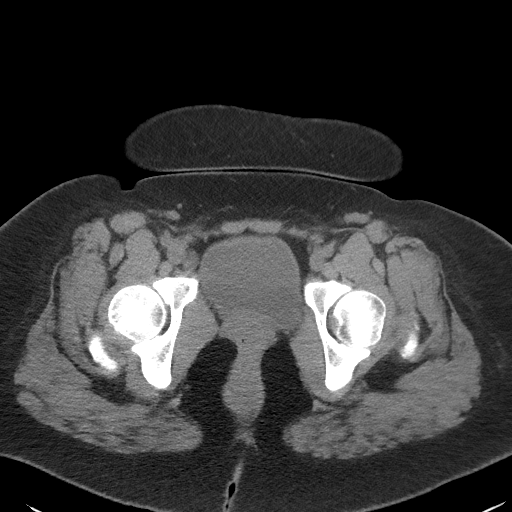
[im 16/91  soft-tissue]
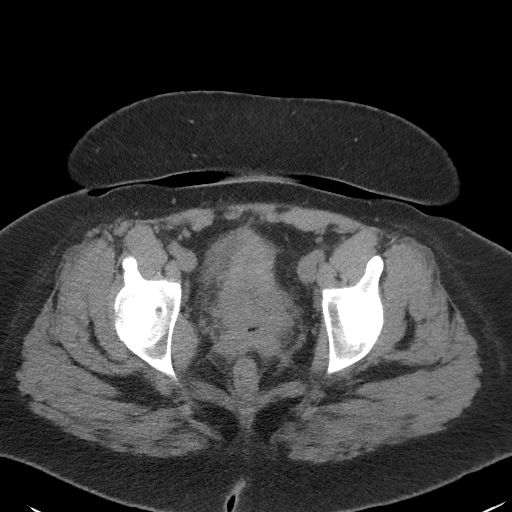
[im 24/91  soft-tissue]
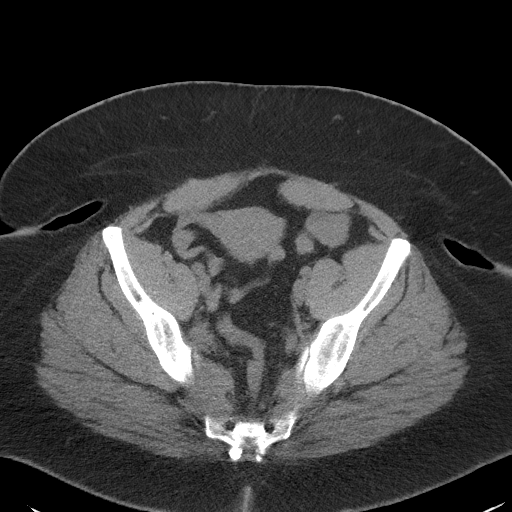
[im 32/91  soft-tissue]
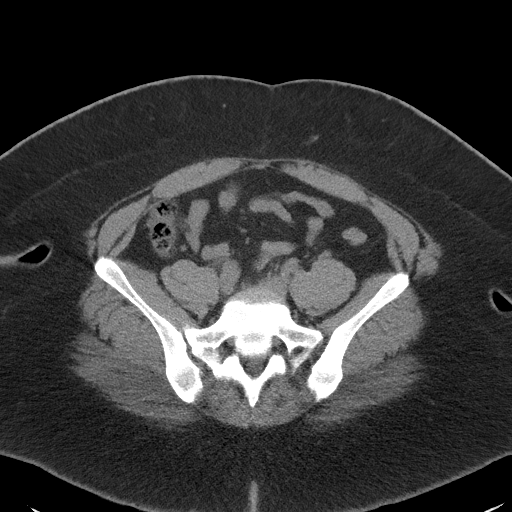
[im 36/91  soft-tissue]
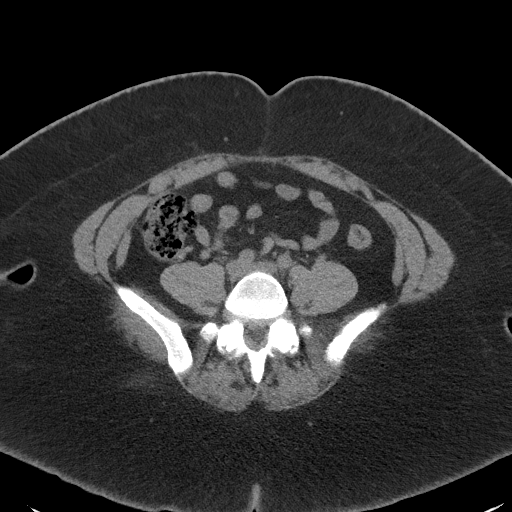
[im 44/91  soft-tissue]
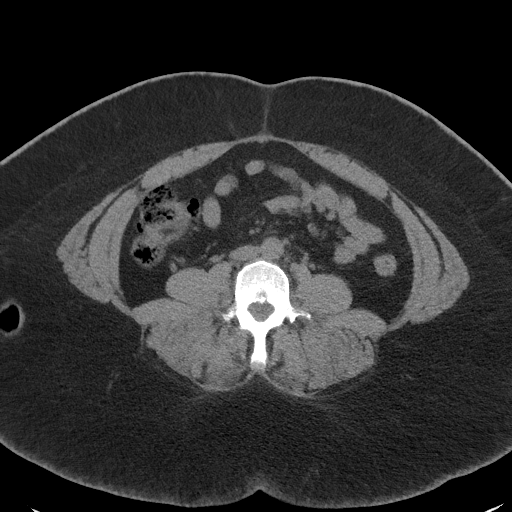
[im 47/91  soft-tissue]
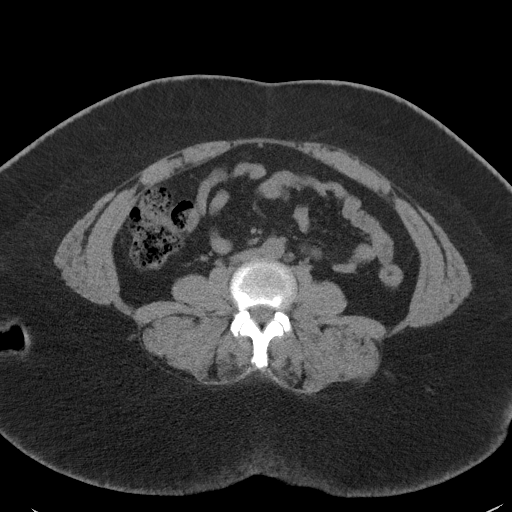
[im 55/91  soft-tissue]
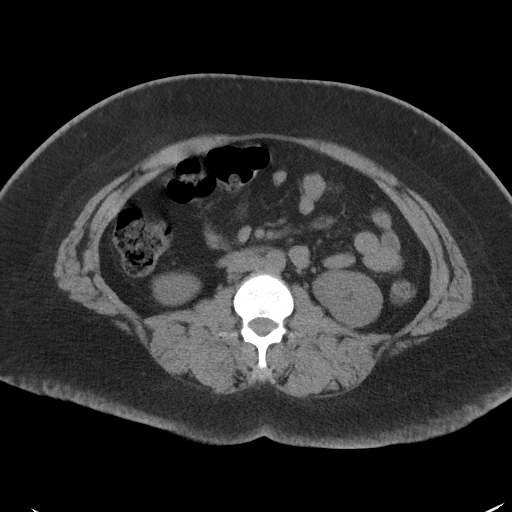
[im 55/91  bone]
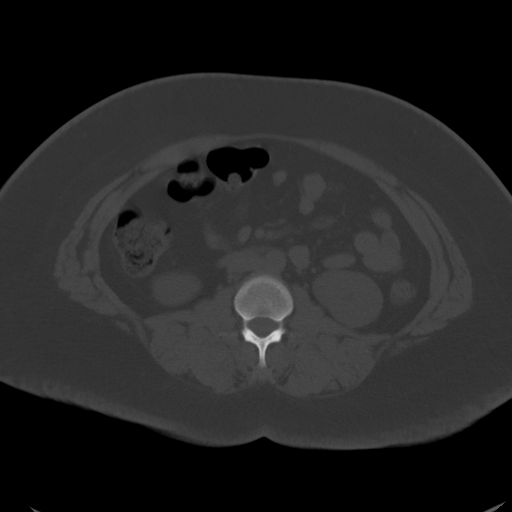
[im 59/91  soft-tissue]
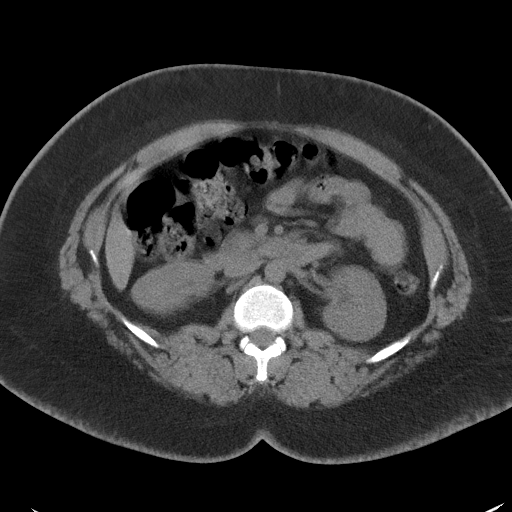
[im 67/91  soft-tissue]
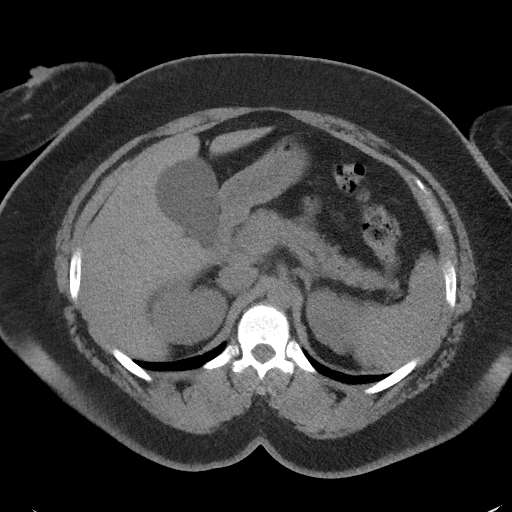
[im 75/91  soft-tissue]
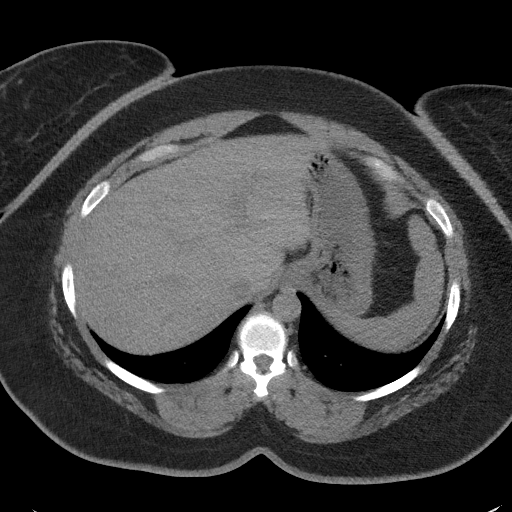
[im 79/91  soft-tissue]
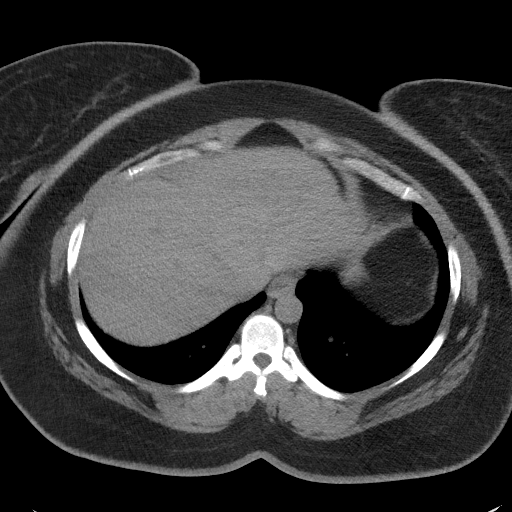
[im 87/91  soft-tissue]
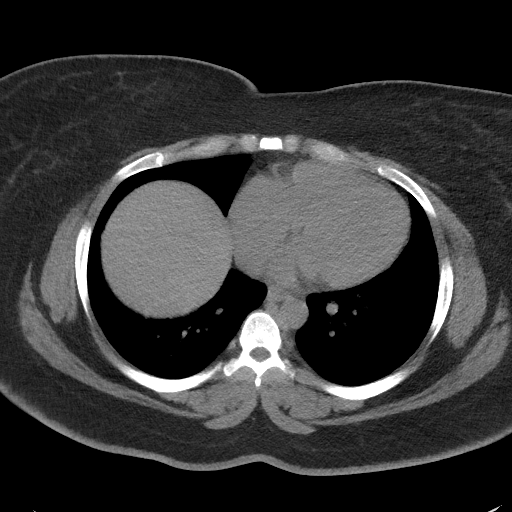

[Series 4: coronal st · coronal · 0.83mm/px · 3 of 92 slices shown]
[im 31/92  soft-tissue]
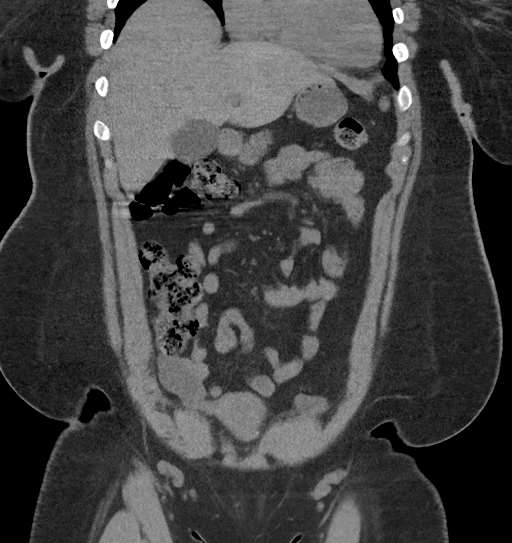
[im 41/92  soft-tissue]
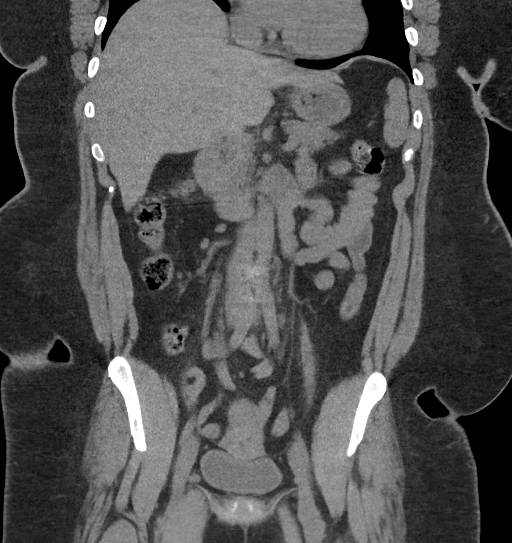
[im 51/92  soft-tissue]
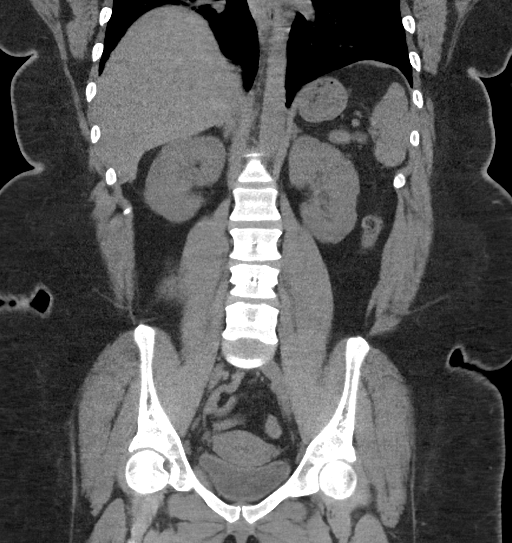

[17 of 46 positions shown; findings below may reference images not displayed]

FINDINGS: LOWER CHEST: Normal.

HEPATOBILIARY: Normal hepatic contours. No intra- or extrahepatic
biliary dilatation. The gallbladder is normal.

PANCREAS: Normal pancreas. No ductal dilatation or peripancreatic
fluid collection.

SPLEEN: Normal.

ADRENALS/URINARY TRACT: The adrenal glands are normal. No
hydronephrosis, nephroureterolithiasis or solid renal mass. The
urinary bladder is normal for degree of distention

STOMACH/BOWEL: There is no hiatal hernia. Normal duodenal course and
caliber. No small bowel dilatation or inflammation. No focal colonic
abnormality. Normal appendix.

VASCULAR/LYMPHATIC: Normal course and caliber of the major abdominal
vessels. No abdominal or pelvic lymphadenopathy.

REPRODUCTIVE: Normal uterus. No adnexal mass.

MUSCULOSKELETAL. No bony spinal canal stenosis or focal osseous
abnormality. Right L5 pars interarticularis defect.

OTHER: None.
IMPRESSION: No acute abnormality of the abdomen or pelvis.
